# Patient Record
Sex: Male | Born: 1940 | Race: White | Hispanic: No | Marital: Married | Smoking: Former smoker
Health system: Southern US, Community
[De-identification: ages and names within clinical notes are randomized; demographics above are authoritative.]

## PROBLEM LIST (undated history)

## (undated) DIAGNOSIS — I10 Essential (primary) hypertension: Secondary | ICD-10-CM

## (undated) DIAGNOSIS — Z902 Acquired absence of lung [part of]: Secondary | ICD-10-CM

## (undated) DIAGNOSIS — R51 Headache: Secondary | ICD-10-CM

## (undated) DIAGNOSIS — E7800 Pure hypercholesterolemia, unspecified: Secondary | ICD-10-CM

## (undated) DIAGNOSIS — I2510 Atherosclerotic heart disease of native coronary artery without angina pectoris: Secondary | ICD-10-CM

## (undated) DIAGNOSIS — I219 Acute myocardial infarction, unspecified: Secondary | ICD-10-CM

## (undated) DIAGNOSIS — E039 Hypothyroidism, unspecified: Secondary | ICD-10-CM

## (undated) DIAGNOSIS — Z950 Presence of cardiac pacemaker: Secondary | ICD-10-CM

## (undated) DIAGNOSIS — R519 Headache, unspecified: Secondary | ICD-10-CM

## (undated) HISTORY — DX: Acute myocardial infarction, unspecified: I21.9

## (undated) HISTORY — PX: CARPAL TUNNEL RELEASE: SHX101

## (undated) HISTORY — DX: Hypothyroidism, unspecified: E03.9

## (undated) HISTORY — DX: Atherosclerotic heart disease of native coronary artery without angina pectoris: I25.10

## (undated) HISTORY — PX: CORONARY ANGIOPLASTY WITH STENT PLACEMENT: SHX49

## (undated) HISTORY — PX: CARDIAC CATHETERIZATION: SHX172

## (undated) HISTORY — PX: HAMMER TOE SURGERY: SHX385

## (undated) HISTORY — PX: LOBECTOMY: SHX5089

## (undated) HISTORY — PX: TOTAL KNEE ARTHROPLASTY: SHX125

## (undated) HISTORY — DX: Pure hypercholesterolemia, unspecified: E78.00

## (undated) HISTORY — PX: SHOULDER SURGERY: SHX246

---

## 2000-09-27 DIAGNOSIS — I2510 Atherosclerotic heart disease of native coronary artery without angina pectoris: Secondary | ICD-10-CM

## 2000-09-27 HISTORY — DX: Atherosclerotic heart disease of native coronary artery without angina pectoris: I25.10

## 2006-05-24 ENCOUNTER — Ambulatory Visit: Payer: Self-pay | Admitting: Gastroenterology

## 2007-01-14 ENCOUNTER — Emergency Department: Payer: Self-pay | Admitting: Emergency Medicine

## 2007-02-11 ENCOUNTER — Inpatient Hospital Stay: Payer: Self-pay | Admitting: Internal Medicine

## 2007-04-15 ENCOUNTER — Other Ambulatory Visit: Payer: Self-pay

## 2007-04-15 ENCOUNTER — Inpatient Hospital Stay: Payer: Self-pay | Admitting: Internal Medicine

## 2007-05-20 ENCOUNTER — Ambulatory Visit: Payer: Self-pay | Admitting: Orthopaedic Surgery

## 2007-09-07 ENCOUNTER — Ambulatory Visit: Payer: Self-pay | Admitting: Orthopaedic Surgery

## 2008-05-20 ENCOUNTER — Emergency Department: Payer: Self-pay | Admitting: Emergency Medicine

## 2009-12-03 ENCOUNTER — Ambulatory Visit: Payer: Self-pay | Admitting: Family Medicine

## 2011-02-24 ENCOUNTER — Ambulatory Visit: Payer: Self-pay | Admitting: Internal Medicine

## 2011-08-09 ENCOUNTER — Ambulatory Visit: Payer: Self-pay | Admitting: General Practice

## 2011-08-23 ENCOUNTER — Inpatient Hospital Stay: Payer: Self-pay | Admitting: General Practice

## 2011-08-23 DIAGNOSIS — Z01810 Encounter for preprocedural cardiovascular examination: Secondary | ICD-10-CM

## 2011-08-27 ENCOUNTER — Encounter: Payer: Self-pay | Admitting: Internal Medicine

## 2011-08-28 ENCOUNTER — Encounter: Payer: Self-pay | Admitting: Internal Medicine

## 2011-09-28 ENCOUNTER — Encounter: Payer: Self-pay | Admitting: Internal Medicine

## 2011-10-29 ENCOUNTER — Encounter: Payer: Self-pay | Admitting: Internal Medicine

## 2012-09-27 DIAGNOSIS — Z902 Acquired absence of lung [part of]: Secondary | ICD-10-CM

## 2012-09-27 HISTORY — DX: Acquired absence of lung (part of): Z90.2

## 2013-03-16 ENCOUNTER — Ambulatory Visit: Payer: Self-pay | Admitting: Gastroenterology

## 2013-03-20 LAB — PATHOLOGY REPORT

## 2013-08-09 ENCOUNTER — Ambulatory Visit: Payer: Self-pay | Admitting: Unknown Physician Specialty

## 2013-12-10 ENCOUNTER — Ambulatory Visit: Payer: Self-pay | Admitting: Specialist

## 2014-01-03 ENCOUNTER — Ambulatory Visit: Payer: Self-pay | Admitting: Cardiothoracic Surgery

## 2014-01-25 ENCOUNTER — Ambulatory Visit: Payer: Self-pay | Admitting: Cardiothoracic Surgery

## 2014-01-28 ENCOUNTER — Ambulatory Visit: Payer: Self-pay | Admitting: Cardiothoracic Surgery

## 2014-02-07 ENCOUNTER — Ambulatory Visit: Payer: Self-pay

## 2014-02-07 LAB — CBC WITH DIFFERENTIAL/PLATELET
BASOS PCT: 0.6 %
Basophil #: 0 10*3/uL (ref 0.0–0.1)
Eosinophil #: 0.1 10*3/uL (ref 0.0–0.7)
Eosinophil %: 1.8 %
HCT: 45.9 % (ref 40.0–52.0)
HGB: 15.3 g/dL (ref 13.0–18.0)
LYMPHS ABS: 2.1 10*3/uL (ref 1.0–3.6)
Lymphocyte %: 38.2 %
MCH: 33.3 pg (ref 26.0–34.0)
MCHC: 33.4 g/dL (ref 32.0–36.0)
MCV: 100 fL (ref 80–100)
Monocyte #: 0.6 x10 3/mm (ref 0.2–1.0)
Monocyte %: 12 %
NEUTROS ABS: 2.6 10*3/uL (ref 1.4–6.5)
Neutrophil %: 47.4 %
Platelet: 164 10*3/uL (ref 150–440)
RBC: 4.61 10*6/uL (ref 4.40–5.90)
RDW: 13.8 % (ref 11.5–14.5)
WBC: 5.4 10*3/uL (ref 3.8–10.6)

## 2014-02-14 LAB — PROTIME-INR
INR: 1
PROTHROMBIN TIME: 12.7 s (ref 11.5–14.7)

## 2014-02-14 LAB — APTT: ACTIVATED PTT: 27.6 s (ref 23.6–35.9)

## 2014-02-15 ENCOUNTER — Inpatient Hospital Stay: Payer: Self-pay

## 2014-02-15 DIAGNOSIS — I442 Atrioventricular block, complete: Secondary | ICD-10-CM

## 2014-02-15 LAB — BASIC METABOLIC PANEL
ANION GAP: 10 (ref 7–16)
BUN: 9 mg/dL (ref 7–18)
Calcium, Total: 8.2 mg/dL — ABNORMAL LOW (ref 8.5–10.1)
Chloride: 100 mmol/L (ref 98–107)
Co2: 26 mmol/L (ref 21–32)
Creatinine: 0.98 mg/dL (ref 0.60–1.30)
EGFR (Non-African Amer.): >60
Glucose: 153 mg/dL — ABNORMAL HIGH (ref 65–99)
Osmolality: 274 (ref 275–301)
Potassium: 4.4 mmol/L (ref 3.5–5.1)
SODIUM: 136 mmol/L (ref 136–145)

## 2014-02-15 LAB — CBC WITH DIFFERENTIAL/PLATELET
Basophil #: 0 10*3/uL (ref 0.0–0.1)
Basophil %: 0.4 %
Eosinophil #: 0 10*3/uL (ref 0.0–0.7)
Eosinophil %: 0 %
HCT: 42.9 % (ref 40.0–52.0)
HGB: 14.5 g/dL (ref 13.0–18.0)
Lymphocyte #: 1 10*3/uL (ref 1.0–3.6)
Lymphocyte %: 11.2 %
MCH: 33.7 pg (ref 26.0–34.0)
MCHC: 33.9 g/dL (ref 32.0–36.0)
MCV: 99 fL (ref 80–100)
MONO ABS: 0.6 x10 3/mm (ref 0.2–1.0)
Monocyte %: 6.5 %
Neutrophil #: 7.6 10*3/uL — ABNORMAL HIGH (ref 1.4–6.5)
Neutrophil %: 81.9 %
Platelet: 169 10*3/uL (ref 150–440)
RBC: 4.31 10*6/uL — ABNORMAL LOW (ref 4.40–5.90)
RDW: 13.8 % (ref 11.5–14.5)
WBC: 9.3 10*3/uL (ref 3.8–10.6)

## 2014-02-16 LAB — BASIC METABOLIC PANEL
Anion Gap: 7 (ref 7–16)
BUN: 9 mg/dL (ref 7–18)
CO2: 30 mmol/L (ref 21–32)
CREATININE: 0.9 mg/dL (ref 0.60–1.30)
Calcium, Total: 8.4 mg/dL — ABNORMAL LOW (ref 8.5–10.1)
Chloride: 98 mmol/L (ref 98–107)
EGFR (African American): >60
EGFR (Non-African Amer.): >60
Glucose: 136 mg/dL — ABNORMAL HIGH (ref 65–99)
OSMOLALITY: 271 (ref 275–301)
Potassium: 4.1 mmol/L (ref 3.5–5.1)
Sodium: 135 mmol/L — ABNORMAL LOW (ref 136–145)

## 2014-02-16 LAB — HEMOGLOBIN: HGB: 13.8 g/dL (ref 13.0–18.0)

## 2014-02-17 LAB — BASIC METABOLIC PANEL
Anion Gap: 1 — ABNORMAL LOW (ref 7–16)
BUN: 6 mg/dL — AB (ref 7–18)
Calcium, Total: 8.4 mg/dL — ABNORMAL LOW (ref 8.5–10.1)
Chloride: 96 mmol/L — ABNORMAL LOW (ref 98–107)
Co2: 32 mmol/L (ref 21–32)
Creatinine: 1.06 mg/dL (ref 0.60–1.30)
EGFR (Non-African Amer.): >60
Glucose: 135 mg/dL — ABNORMAL HIGH (ref 65–99)
OSMOLALITY: 259 (ref 275–301)
Potassium: 4.3 mmol/L (ref 3.5–5.1)
Sodium: 129 mmol/L — ABNORMAL LOW (ref 136–145)

## 2014-02-17 LAB — CBC WITH DIFFERENTIAL/PLATELET
Basophil #: 0 10*3/uL (ref 0.0–0.1)
Basophil %: 0.3 %
Eosinophil #: 0 10*3/uL (ref 0.0–0.7)
Eosinophil %: 0.2 %
HCT: 40.5 % (ref 40.0–52.0)
HGB: 13.7 g/dL (ref 13.0–18.0)
LYMPHS PCT: 13.7 %
Lymphocyte #: 1.3 10*3/uL (ref 1.0–3.6)
MCH: 33.8 pg (ref 26.0–34.0)
MCHC: 33.8 g/dL (ref 32.0–36.0)
MCV: 100 fL (ref 80–100)
Monocyte #: 1.2 x10 3/mm — ABNORMAL HIGH (ref 0.2–1.0)
Monocyte %: 12.1 %
Neutrophil #: 7 10*3/uL — ABNORMAL HIGH (ref 1.4–6.5)
Neutrophil %: 73.7 %
PLATELETS: 144 10*3/uL — AB (ref 150–440)
RBC: 4.06 10*6/uL — AB (ref 4.40–5.90)
RDW: 13.4 % (ref 11.5–14.5)
WBC: 9.5 10*3/uL (ref 3.8–10.6)

## 2014-02-18 LAB — CBC WITH DIFFERENTIAL/PLATELET
BASOS PCT: 0.5 %
Basophil #: 0 10*3/uL (ref 0.0–0.1)
EOS PCT: 1.1 %
Eosinophil #: 0.1 10*3/uL (ref 0.0–0.7)
HCT: 37.1 % — AB (ref 40.0–52.0)
HGB: 12.8 g/dL — AB (ref 13.0–18.0)
LYMPHS PCT: 13.3 %
Lymphocyte #: 1.2 10*3/uL (ref 1.0–3.6)
MCH: 34.2 pg — AB (ref 26.0–34.0)
MCHC: 34.4 g/dL (ref 32.0–36.0)
MCV: 99 fL (ref 80–100)
MONO ABS: 1.1 x10 3/mm — AB (ref 0.2–1.0)
Monocyte %: 11.3 %
NEUTROS PCT: 73.8 %
Neutrophil #: 6.8 10*3/uL — ABNORMAL HIGH (ref 1.4–6.5)
Platelet: 136 10*3/uL — ABNORMAL LOW (ref 150–440)
RBC: 3.73 10*6/uL — AB (ref 4.40–5.90)
RDW: 13.4 % (ref 11.5–14.5)
WBC: 9.3 10*3/uL (ref 3.8–10.6)

## 2014-02-18 LAB — BASIC METABOLIC PANEL
Anion Gap: 0 — ABNORMAL LOW (ref 7–16)
BUN: 5 mg/dL — ABNORMAL LOW (ref 7–18)
Calcium, Total: 8.5 mg/dL (ref 8.5–10.1)
Chloride: 98 mmol/L (ref 98–107)
Co2: 31 mmol/L (ref 21–32)
Creatinine: 0.89 mg/dL (ref 0.60–1.30)
EGFR (African American): >60
Glucose: 145 mg/dL — ABNORMAL HIGH (ref 65–99)
OSMOLALITY: 259 (ref 275–301)
Potassium: 4.3 mmol/L (ref 3.5–5.1)
Sodium: 129 mmol/L — ABNORMAL LOW (ref 136–145)

## 2014-02-19 LAB — CBC WITH DIFFERENTIAL/PLATELET
BASOS ABS: 0 10*3/uL (ref 0.0–0.1)
Basophil %: 0.3 %
EOS ABS: 0.1 10*3/uL (ref 0.0–0.7)
EOS PCT: 1.3 %
HCT: 35.5 % — AB (ref 40.0–52.0)
HGB: 12.1 g/dL — AB (ref 13.0–18.0)
LYMPHS ABS: 1.1 10*3/uL (ref 1.0–3.6)
LYMPHS PCT: 12.6 %
MCH: 34.1 pg — AB (ref 26.0–34.0)
MCHC: 34.3 g/dL (ref 32.0–36.0)
MCV: 100 fL (ref 80–100)
Monocyte #: 1 x10 3/mm (ref 0.2–1.0)
Monocyte %: 11.3 %
NEUTROS PCT: 74.5 %
Neutrophil #: 6.7 10*3/uL — ABNORMAL HIGH (ref 1.4–6.5)
PLATELETS: 159 10*3/uL (ref 150–440)
RBC: 3.56 10*6/uL — AB (ref 4.40–5.90)
RDW: 13.4 % (ref 11.5–14.5)
WBC: 9 10*3/uL (ref 3.8–10.6)

## 2014-02-20 LAB — CBC WITH DIFFERENTIAL/PLATELET
Basophil #: 0 10*3/uL (ref 0.0–0.1)
Basophil %: 0.2 %
Eosinophil #: 0.2 10*3/uL (ref 0.0–0.7)
Eosinophil %: 3.2 %
HCT: 36.2 % — AB (ref 40.0–52.0)
HGB: 12.2 g/dL — ABNORMAL LOW (ref 13.0–18.0)
LYMPHS PCT: 18 %
Lymphocyte #: 1.4 10*3/uL (ref 1.0–3.6)
MCH: 33.7 pg (ref 26.0–34.0)
MCHC: 33.8 g/dL (ref 32.0–36.0)
MCV: 100 fL (ref 80–100)
Monocyte #: 1 x10 3/mm (ref 0.2–1.0)
Monocyte %: 12.6 %
NEUTROS ABS: 5 10*3/uL (ref 1.4–6.5)
NEUTROS PCT: 66 %
Platelet: 176 10*3/uL (ref 150–440)
RBC: 3.62 10*6/uL — ABNORMAL LOW (ref 4.40–5.90)
RDW: 13.2 % (ref 11.5–14.5)
WBC: 7.6 10*3/uL (ref 3.8–10.6)

## 2014-02-20 LAB — BASIC METABOLIC PANEL
ANION GAP: 5 — AB (ref 7–16)
BUN: 6 mg/dL — ABNORMAL LOW (ref 7–18)
CHLORIDE: 95 mmol/L — AB (ref 98–107)
Calcium, Total: 8.6 mg/dL (ref 8.5–10.1)
Co2: 31 mmol/L (ref 21–32)
Creatinine: 0.59 mg/dL — ABNORMAL LOW (ref 0.60–1.30)
Glucose: 128 mg/dL — ABNORMAL HIGH (ref 65–99)
OSMOLALITY: 262 (ref 275–301)
POTASSIUM: 3.7 mmol/L (ref 3.5–5.1)
SODIUM: 131 mmol/L — AB (ref 136–145)

## 2014-02-25 LAB — PATHOLOGY REPORT

## 2014-02-28 ENCOUNTER — Ambulatory Visit: Payer: Self-pay | Admitting: Cardiothoracic Surgery

## 2014-03-27 ENCOUNTER — Ambulatory Visit: Payer: Self-pay | Admitting: Cardiothoracic Surgery

## 2014-06-20 ENCOUNTER — Ambulatory Visit: Payer: Self-pay | Admitting: Cardiothoracic Surgery

## 2014-06-27 ENCOUNTER — Ambulatory Visit: Payer: Self-pay | Admitting: Cardiothoracic Surgery

## 2015-01-18 NOTE — Op Note (Signed)
PATIENT NAME:  Jose [name], Jose [name] MR#:  [id number] DATE OF BIRTH:  1941-08-24  DATE OF PROCEDURE:  02/15/2014  SURGEON: Dr. Thelma Barge.   ASSISTANT: Marshia Ly, MD  PREOPERATIVE DIAGNOSIS: Right upper lobe mass.   POSTOPERATIVE DIAGNOSIS: Right upper lobe mass.   OPERATION PERFORMED:  1. Preoperative bronchoscopy to assess endobronchial anatomy.  2. Right thoracotomy with right upper lobectomy and mediastinal lymph node dissection.   INDICATIONS FOR PROCEDURE: Jose [name] is a 74 year old gentleman with a past medical history of remote smoking who has developed a right upper lobe mass. This measured about 2-cm in size and had some sub solid and solid components. It was worrisome for a malignancy and he was offered the above-named procedure for definitive diagnosis. In addition, if this were a malignancy, the patient would have had appropriate therapy. He was apprised of the indications and risks, and he gave his informed consent.   DESCRIPTION OF PROCEDURE: The patient was brought to the operating suite and placed in the supine position. General anesthesia was given. The patient was then intubated with a single-lumen tube and a bronchial blocker was placed in the right mainstem. There was no evidence of endobronchial tumor in any of the major airways. The patient was then turned for a right thoracotomy. All pressure points were carefully padded. The patient was prepped and draped in the usual sterile fashion. A posterolateral fifth interspace thoracotomy was performed. The latissimus muscle was divided but the serratus was spared. Upon entering the chest, careful palpation of the lung revealed a vague density within the right upper lobe. There was no obvious tumor outside the lung. The fissure between the upper and middle lobe was nearly nonexistent. Likewise the fissure between the upper lobe and the lower lobe posteriorly was likewise very deficient. We began by opening the mediastinal pleura over the superior  pulmonary vein and pulmonary artery. The branches of the pulmonary artery were identified. The middle lobe vein was identified and separated from the upper lobe vein. At this point, we attempted to identify the pulmonary artery in the depths of the fissure. This took considerable dissection as the planes were difficult and the fissures were nearly incomplete. Once the pulmonary artery was identified in the depths of the fissure, we could then trace the posterior segmental vein over to the hilum and then we were able to divide the fissure between the upper lobe and the middle lobe. Prior to this point, we had divided the upper lobe arterial branches and the upper lobe venous branches. The fissure was completed anteriorly as mentioned with the stapler. The only remaining structure at this point was the posterior ascending branch and the incomplete fissure posteriorly. The posterior ascending branch was taken with a vascular stapler and the fissure completed with a regular stapler using gold loads. The bronchus was the only remaining structure and we swept all the lymph nodes up into the bronchus and then divided the bronchus with a TX30 stapler. The middle and lower lobes ventilated nicely prior to firing the stapler. Once we fired the stapler, we then removed the specimen and performed a complete lymphadenectomy within the pretracheal space and in the hilar areas. All lymph nodes were removed and appropriately labeled. We then checked the bronchial stump at 30 cm of water pressure and we did not identify any air leak. Two chest tubes were inserted in a standard fashion. An angled and a straight 28 tube were placed. The stapling port site was closed with 0 Vicryl, 2-0  Vicryl, and nylon. The chest was closed after stapling the middle and lower lobes together as these were quite free. The ribs were reapproximated with #2 Vicryl. The latissimus muscle was closed with #2 Vicryl, the subcutaneous tissues with 2-0 Vicryl,  and the skin with skin clips. The patient was then rolled in the supine position where he was then extubated and taken to the recovery room.    ____________________________ Sheppard Plumberimothy E. Thelma Bargeaks, MD teo:lt D: 02/15/2014 20:04:40 ET T: 02/16/2014 05:24:59 ET JOB#: [id number]  cc: Marcial Pacasimothy E. Thelma Bargeaks, MD, <Dictator> Danella PentonMark F. Miller, MD Jasmine DecemberIMOTHY E Jamiaya Bina MD ELECTRONICALLY SIGNED 03/02/2014 19:51

## 2015-01-18 NOTE — Discharge Summary (Signed)
PATIENT NAME:  Jose [name], [name] MR#:  [id number] DATE OF BIRTH:  [birth year]  DATE OF ADMISSION:  02/15/2014 DATE OF DISCHARGE:  02/23/2014  ADMITTING DIAGNOSIS: Right upper lobe mass.    DISCHARGE DIAGNOSES: Right upper lobe mass. (Pulmonary infarct).  HOSPITAL COURSE: [name] is a 74 year old gentleman with a newly diagnosed right upper lobe mass. He did have a remote smoking history and there was some concern that this may represent a malignancy. He was therefore offered the above-named procedure for definitive diagnosis and treatment. He was brought to the Operating Room on 02/15/2014, where he underwent a right upper lobectomy. His is final pathology revealed no evidence of malignancy, but rather a pulmonary infarct. There were no other pathologic findings within the lung. His postoperative course was complicated by his atrial arrhythmias and he was seen by our cardiologist on several occasions, but ultimately nothing was required for his atrial arrhythmia, and he was managed on the floor. He did have his chest tubes removed on the 05/30 and a followup chest x-ray showed the lung to be fully expanded. The patient was discharged to home.   DISCHARGE MEDICATIONS: Included: Simvastatin, levothyroxine, metoprolol, Norco 7.5/325, simethicone for gas. He was scheduled to follow up with Dr. Thelma Bargeaks in one week. He will also follow-up with his primary care physician, Dr. Judithann SheenSparks in one month.     ____________________________ Sheppard Plumberimothy E. Thelma Bargeaks, MD teo:lm D: 03/02/2014 19:56:00 ET T: 03/03/2014 01:47:56 ET JOB#: [id number]  cc: Marcial Pacasimothy E. Thelma Bargeaks, MD, <Dictator> Jasmine DecemberIMOTHY E Dynastie Knoop MD ELECTRONICALLY SIGNED 03/13/2014 14:48

## 2015-01-18 NOTE — Consult Note (Signed)
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   Patient was seen by consultant.   Consult note dictated.   Comments: REC No further treatment.  Electronic Signatures: Marcina MillardParaschos, Carsen Machi (MD)  (Signed 29-May-15 19:11)  Authored: Brief Consult Note   Last Updated: 29-May-15 19:11 by Marcina MillardParaschos, Nikita Humble (MD)

## 2015-01-18 NOTE — Consult Note (Signed)
PATIENT NAME:  Jose [name], Jose D MR#:  [id number] DATE OF BIRTH:  1941-07-06  DATE OF CONSULTATION:  02/15/2014  REFERRING PHYSICIAN:  Timothy E. Thelma Bargeaks, MD CONSULTING PHYSICIAN:  Francoise Chojnowski A. Allena KatzPatel, MD  PRIMARY CARE PHYSICIAN: Danella PentonMark F. Miller, MD  CARDIOLOGIST: Marcina MillardAlexander Paraschos, MD   REASON FOR CONSULTATION: Postop medical management.  HISTORY OF PRESENT ILLNESS: Mr. Jose [name] is a 74 year old Caucasian gentleman with past medical history of coronary artery disease, status post stent in mid RCA in 2002 and 50% to 60% stenosis of proximal LAD by cardiac cath of July 2008; hyperlipidemia; history of Mobitz I  second-degree AV block, was admitted for elective right upper lobectomy for a right upper lobe mass. The surgery was performed by Dr. Thelma Bargeaks. Internal medicine was consulted for postop medical management. The patient was noted to have heart rate drop down into 45 transiently. During my evaluation, the patient has been complaining of some chest pain and heart rate is in the 60s. Surgery was done under general anesthesia. He is currently on IV ropivacaine epidural drip along with fentanyl for pain management. The patient denies any chest pain or shortness of breath.   PAST MEDICAL HISTORY:  1.  Hammertoe repair, right foot.  2.  Hypercholesteremia.  3.  Hypothyroidism.  4.  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  5.  Right total knee placement.  6.  Shoulder surgery.   ALLERGIES: AMOXICILLIN/AUGMENTIN.   SOCIAL HISTORY: Married and retired. Has 2 children. Occasional alcohol. Ex-smoker.   FAMILY HISTORY: Father died at 5693 of CVA. Mother died at age 648 of MI.    HOME MEDICATIONS: 1.  Simvastatin 40 mg p.o. daily.  2.  Metoprolol extended-release 25 mg daily.  3.  Levothyroxine 25 mcg p.o. daily.  4.  Tylenol 500 mg 1 tablet b.i.d.   REVIEW OF SYSTEMS:   CARDIOVASCULAR: Positive for chest pain at chest tube site. No arrhythmia. No syncope. No dizziness.  GASTROINTESTINAL: No  nausea, vomiting, diarrhea, or abdominal pain.  GENITOURINARY: No dysuria, hematuria, or frequency.  ENDOCRINE: No polyuria, nocturia, or thyroid problems.  HEMATOLOGIC: No anemia or easy bruising or bleeding.  SKIN: No acne or rash.  NEUROLOGIC: No CVA, TIA, vertigo, or dizziness.  PSYCHIATRIC: No anxiety or depression.   All other systems reviewed and negative.   PHYSICAL EXAMINATION: VITAL SIGNS: The patient is seen in the ICU. He is afebrile. Pulse is 62. Blood pressure is 125/62. Sats are 97% on 2 liters.  GENERAL: The patient is obese, in mild to moderate distress due to chest pain.  HEENT: Atraumatic, normocephalic. Pupils: PERRLA. EOM intact. Oral mucosa is moist.  NECK: Supple. No JVD. No carotid bruit.  LUNGS: Clear to auscultation bilaterally. Decreased breath sounds on the right side in the bases. No respiratory distress or labored breathing.  HEART: Both the heart sounds are normal. Rate, rhythm regular. PMI not lateralized. Chest nontender.  EXTREMITIES: Good pedal pulses, good femoral pulses. Trace lower extremity edema.  NEUROLOGIC: Unable to assess in detail secondary to the patient being postop; however, moves all extremities well.  SKIN: Warm and dry.   LABORATORY, DIAGNOSTIC, AND RADIOLOGICAL DATA: Chest x-ray shows postoperative right chest with new right mediastinal distortion. No pneumothorax. Right pleural effusion. Opacity at the peripheral left base could be from under-penetration, small effusion, or lung capacity. CBC within normal limits. Basic metabolic panel within normal limits, except glucose of 153. PT-INR is 12.7 and 1. EKG shows rate of 71 with second-degree Mobitz  type II.   ASSESSMENT: A 74 year old Mr. Jose [name] with history of hypothyroidism, coronary artery disease, status post stent in the remote past, is postop right upper lobectomy. Internal medicine was consulted for:  1.  Postoperative management: The patient was noted to have intermittent  bradycardia. Spoke with Dr. Lady Gary reviewed EKG. Will hold off on beta blockers. Monitor on telemetry. Heart rate currently is anywhere from 60 to 71. The patient has EKG suggestive of Mobitz I second-degree AV block. His primary cardiologist is Dr. Darrold Junker. He has had a stress test prior to the surgery, which was essentially within normal limits. Will cycle cardiac enzymes x 3 and consider cardiology consultation if needed.  2.  Hypothyroidism: Continue levothyroxine.  3.  Deep vein thrombosis prophylaxis: On sequential compression devices and TEDs.  4.  Right upper lobectomy secondary to right upper lobe mass suspicious for possible malignancy. The patient is postop day zero. Further management by Dr. Thelma Barge.  5.  Hyperlipidemia: Continue simvastatin.  6.  Further workup according to the patient's clinical course.  Thank you for the consult. Will follow while the patient is in-house.   TIME SPENT: 50 minutes. The above was discussed with Dr. Thelma Barge.   ____________________________ [name]. Allena Katz, MD sap:jcm D: 02/15/2014 18:53:30 ET T: 02/15/2014 19:33:17 ET JOB#: [id number]  cc: Yaire Kreher A. Allena Katz, MD, <Dictator> Willow Ora MD ELECTRONICALLY SIGNED 02/17/2014 16:26

## 2015-01-18 NOTE — Consult Note (Signed)
   Present Illness 74 yo male with history of chornic Mobitz 1 heart block who is post op RUL lung resection. Telemetry and EKG have revealed second degree heart block with intermittant of what appears av dissociation. Pt ventricular response was stable. No prolonged pasuses. Hemodynamically stable. Has history of cad sp pci or rca in 2002. NO evidence of ischemia present.   Physical Exam:  GEN well nourished   HEENT PERRL, hearing intact to voice   NECK supple   RESP no use of accessory muscles  right sided chest  tube in place   CARD Irregular rate and rhythm  No murmur   ABD denies tenderness  no Adominal Mass   LYMPH negative neck, negative axillae   EXTR negative cyanosis/clubbing, negative edema   SKIN normal to palpation   NEURO cranial nerves intact, motor/sensory function intact   PSYCH A+O to time, place, person   Review of Systems:  Subjective/Chief Complaint right sided chest wall pain at chest tube site   General: Fatigue   Skin: No Complaints   ENT: No Complaints   Eyes: No Complaints   Neck: No Complaints   Respiratory: Short of breath   Cardiovascular: Dyspnea   Gastrointestinal: No Complaints   Genitourinary: No Complaints   Vascular: No Complaints   Musculoskeletal: No Complaints   Neurologic: No Complaints   Hematologic: No Complaints   Endocrine: No Complaints   Psychiatric: No Complaints   Review of Systems: All other systems were reviewed and found to be negative   Medications/Allergies Reviewed Medications/Allergies reviewed   Family & Social History:  Family and Social History:  Family History Non-Contributory   Social History negative tobacco   EKG:  Interpretation sinus rhtyhm with mobitz 1 heart block with intermitant of what appears to be av dissociation.    Augmentin: N/V   Impression Pt with history of mobitz 1 heart block per Dr. Darrold JunkerParaschos who is s/p rul lung resection. EKG shows persistant type 2 and  occasional av dissasociation. Ventricular rate is stable. This is a chronic condition. Does not appear to be hemodynaically significant. Would not place a ppm or temp pacer at this timie. Will follow rhythm.   Plan 1. Continue post op care 2. Follow rhythm 3. WQould not place ppm or temp pacer at present. 4. WIll resume metoprolol succinate 25 daily which he was on as outpatient   Electronic Signatures: Dalia HeadingFath, Laylanie Kruczek A (MD)  (Signed 24-May-15 08:59)  Authored: General Aspect/Present Illness, History and Physical Exam, Review of System, Family & Social History, Home Medications, EKG , Allergies, Impression/Plan   Last Updated: 24-May-15 08:59 by Dalia HeadingFath, Terrilyn Tyner A (MD)

## 2015-01-20 IMAGING — CR RIGHT HAND - COMPLETE 3+ VIEW
1 series · 4 of 4 positions shown · non-contrast
Comparison: None.

CLINICAL DATA: Right hand swelling and erythema.

EXAM:
RIGHT HAND - COMPLETE 3+ VIEW

[Series 1: pa · 0.17mm/px · 4 of 4 slices shown]
[im 1/4]
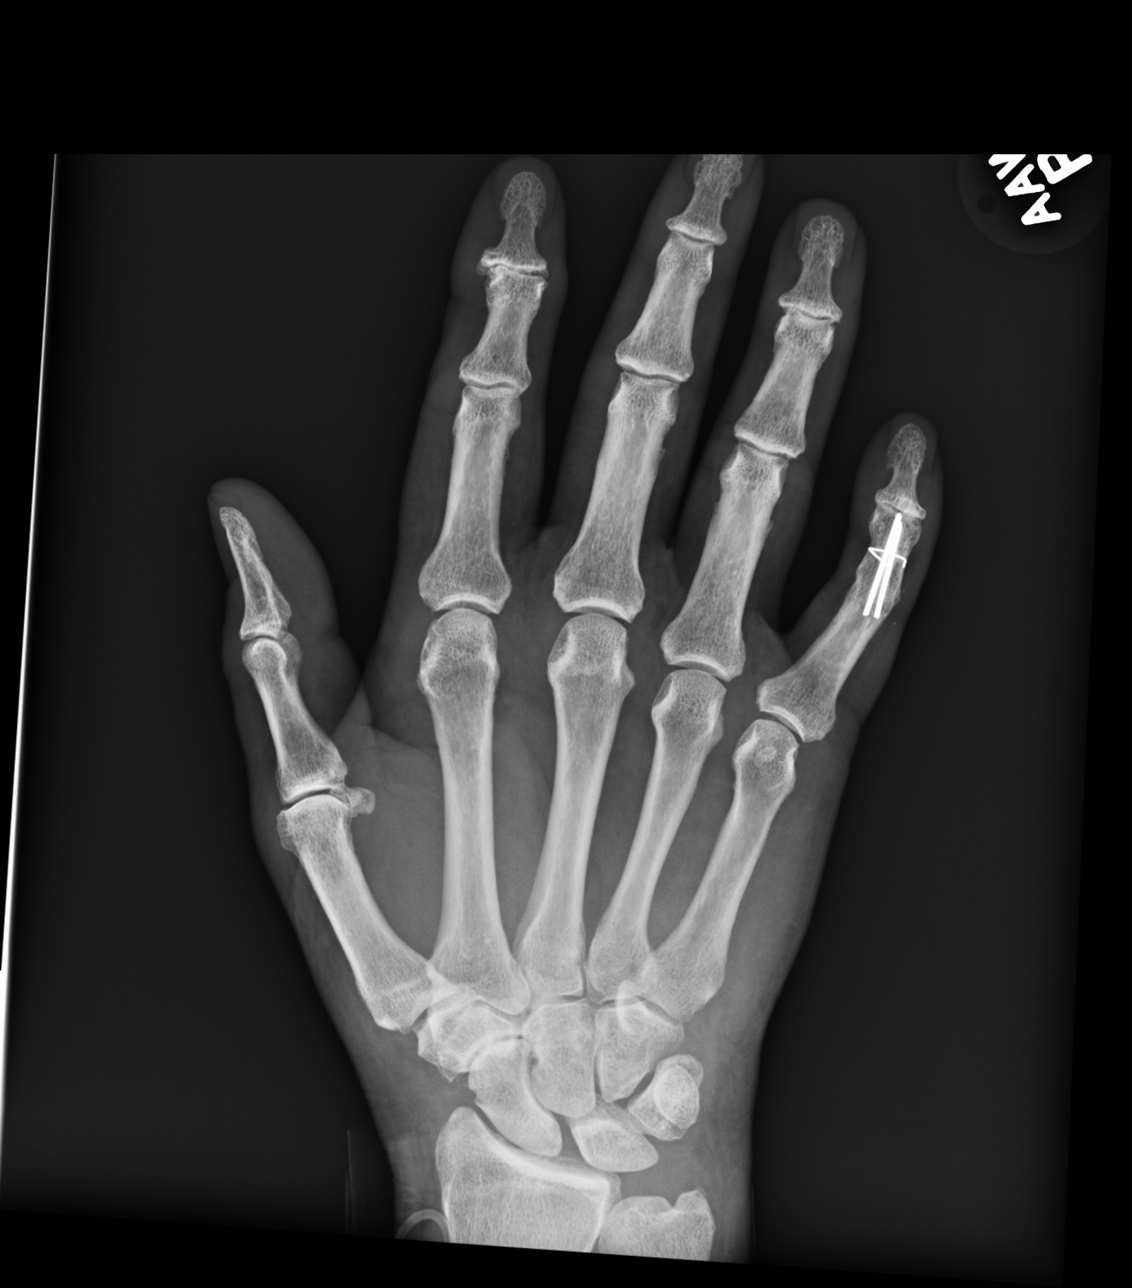
[im 2/4]
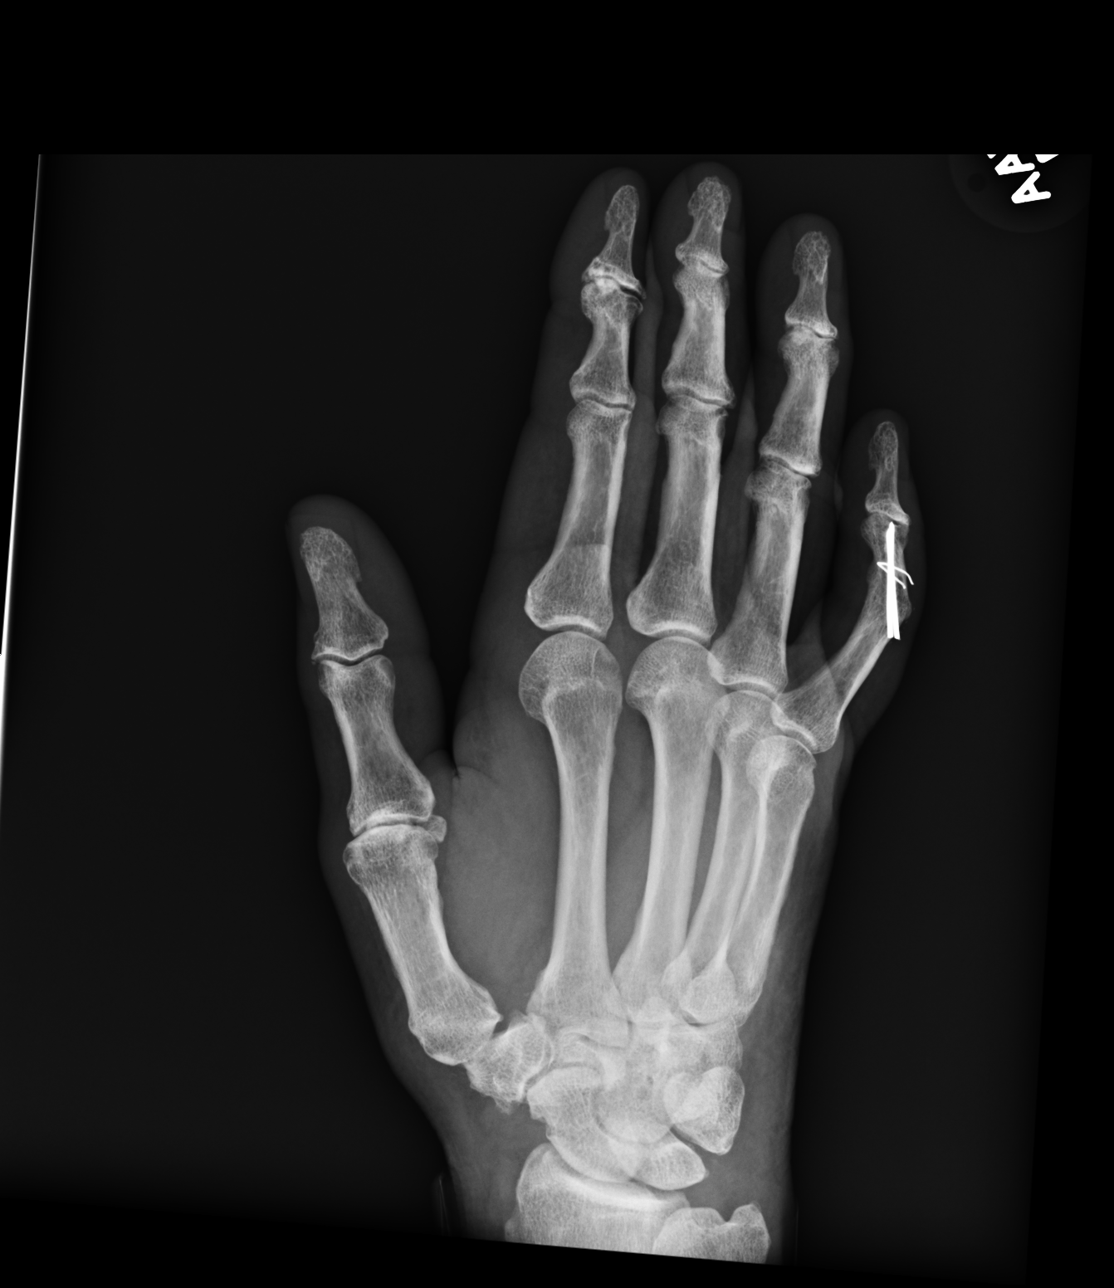
[im 3/4]
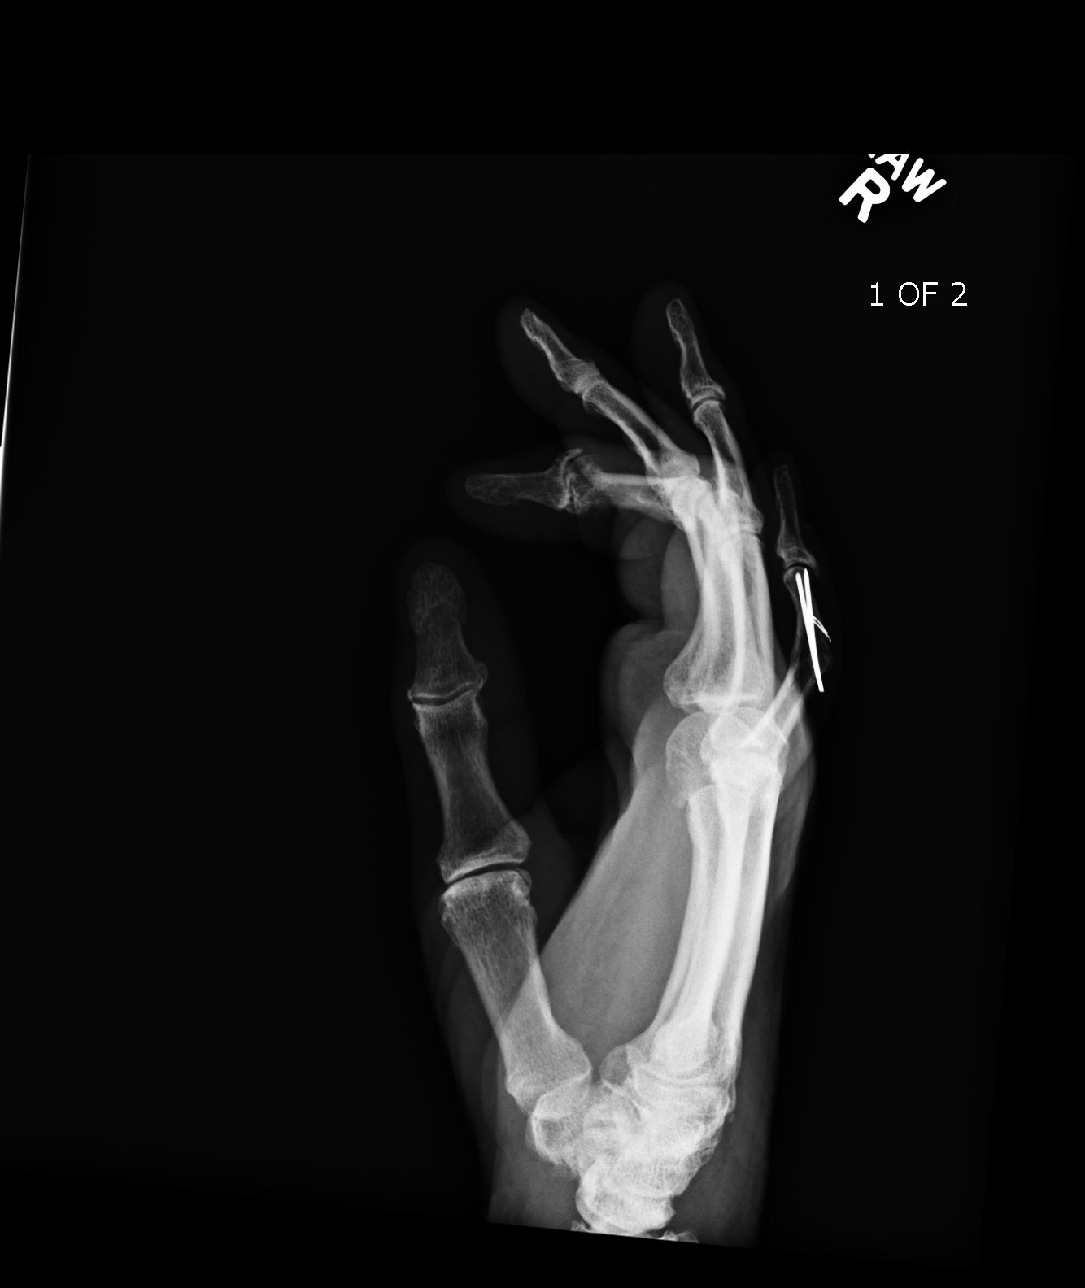
[im 4/4]
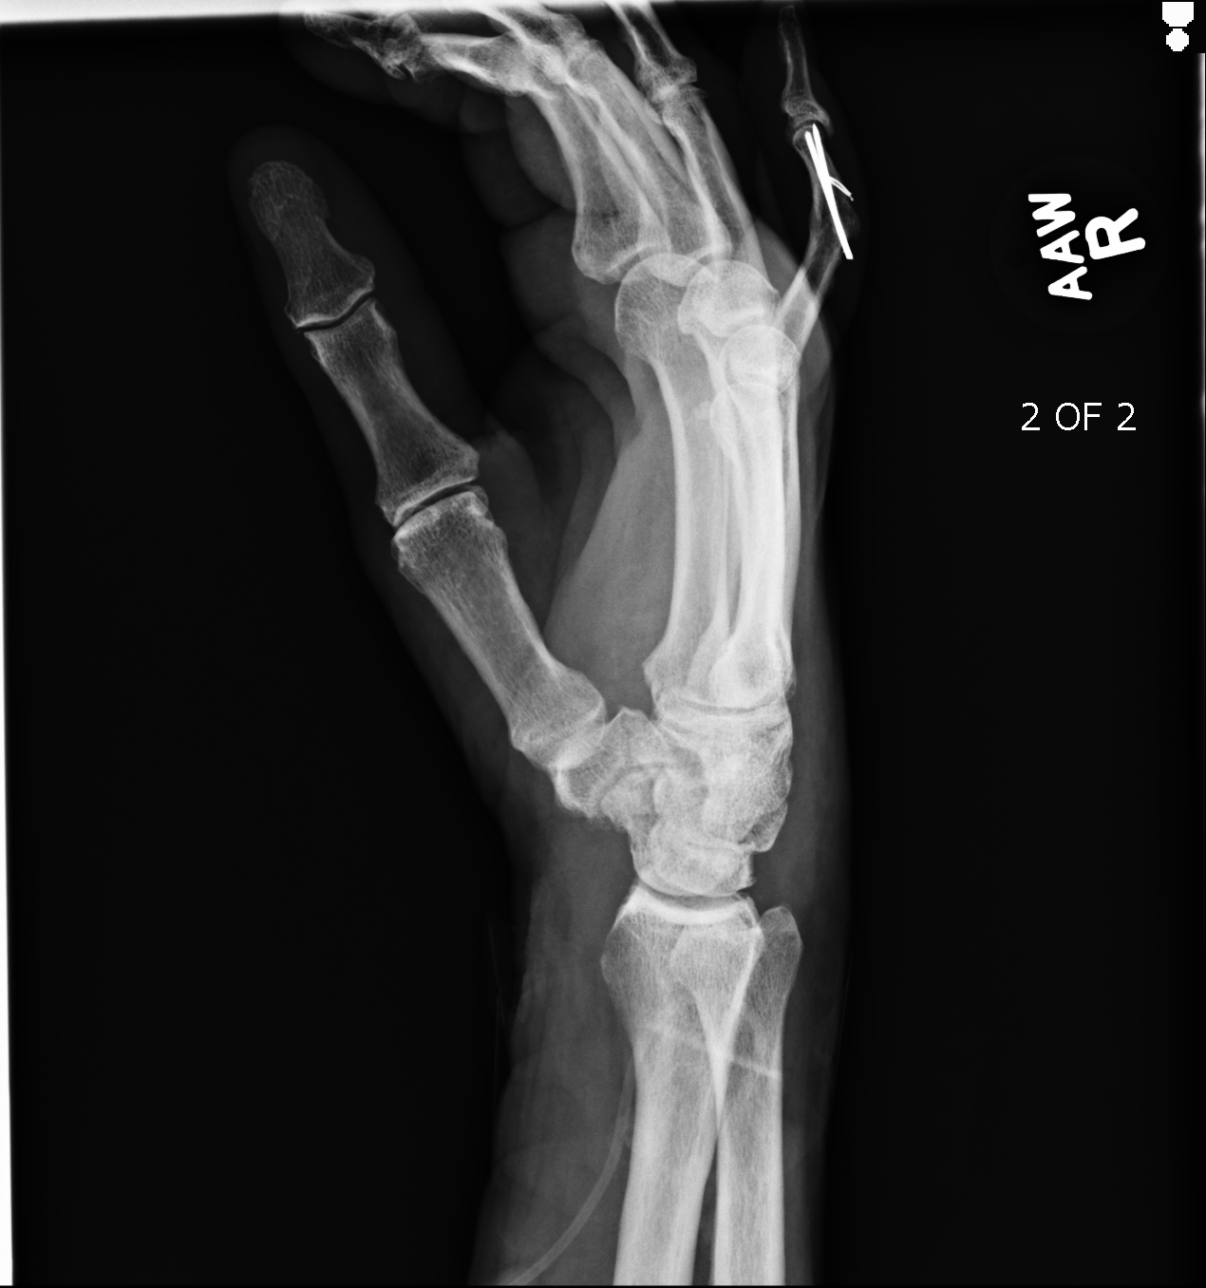

[4 of 4 positions shown; findings below may reference images not displayed]

FINDINGS: The mineralization and alignment are normal. There is no evidence of
acute fracture or dislocation. There are postsurgical changes status
post pin and wire arthrodesis of the fifth middle and proximal
phalanges. There are scattered interphalangeal degenerative changes,
most advanced at the second distal interphalangeal joint. No erosive
changes are evident. The soft tissues appear diffusely prominent
without apparent focal swelling.
IMPRESSION: No acute osseous findings. Osteoarthritis and postsurgical changes
as described.

## 2015-02-25 HISTORY — PX: INSERT / REPLACE / REMOVE PACEMAKER: SUR710

## 2015-03-12 ENCOUNTER — Other Ambulatory Visit: Payer: Self-pay | Admitting: Internal Medicine

## 2015-03-12 DIAGNOSIS — R918 Other nonspecific abnormal finding of lung field: Secondary | ICD-10-CM

## 2015-03-18 ENCOUNTER — Ambulatory Visit
Admission: RE | Admit: 2015-03-18 | Discharge: 2015-03-18 | Disposition: A | Discharge status: Home / Self Care | Payer: Medicare Other | Source: Ambulatory Visit | Attending: Internal Medicine | Admitting: Internal Medicine

## 2015-03-18 DIAGNOSIS — R05 Cough: Secondary | ICD-10-CM | POA: Diagnosis present

## 2015-03-18 DIAGNOSIS — R918 Other nonspecific abnormal finding of lung field: Secondary | ICD-10-CM

## 2015-03-18 HISTORY — DX: Essential (primary) hypertension: I10

## 2015-03-18 HISTORY — DX: Presence of cardiac pacemaker: Z95.0

## 2015-03-18 MED ORDER — IOHEXOL 300 MG/ML  SOLN
75.0000 mL | Freq: Once | INTRAMUSCULAR | Status: AC | PRN
  Administered 2015-03-18: 75 mL via INTRAVENOUS

## 2015-04-29 ENCOUNTER — Ambulatory Visit: Payer: Self-pay | Admitting: Cardiovascular Disease

## 2015-05-02 ENCOUNTER — Encounter: Payer: Self-pay | Admitting: Cardiovascular Disease

## 2015-05-02 ENCOUNTER — Encounter (INDEPENDENT_AMBULATORY_CARE_PROVIDER_SITE_OTHER): Payer: Self-pay

## 2015-05-02 ENCOUNTER — Ambulatory Visit (INDEPENDENT_AMBULATORY_CARE_PROVIDER_SITE_OTHER): Payer: Medicare Other | Admitting: Cardiovascular Disease

## 2015-05-02 VITALS — BP 118/68 | HR 66 | Ht 73.0 in | Wt 253.2 lb

## 2015-05-02 DIAGNOSIS — I495 Sick sinus syndrome: Secondary | ICD-10-CM

## 2015-05-02 DIAGNOSIS — Z950 Presence of cardiac pacemaker: Secondary | ICD-10-CM | POA: Diagnosis not present

## 2015-05-02 DIAGNOSIS — I2510 Atherosclerotic heart disease of native coronary artery without angina pectoris: Secondary | ICD-10-CM

## 2015-05-02 DIAGNOSIS — Z955 Presence of coronary angioplasty implant and graft: Secondary | ICD-10-CM

## 2015-05-02 DIAGNOSIS — E785 Hyperlipidemia, unspecified: Secondary | ICD-10-CM

## 2015-05-02 NOTE — Assessment & Plan Note (Signed)
Acute onset of bradycardia 2 months ago, status post pacemaker We'll arrange follow-up with EP, Dr. Graciela Husbands for pacemaker check

## 2015-05-02 NOTE — Assessment & Plan Note (Signed)
CT scan reviewed, dual-chamber device Medtronic

## 2015-05-02 NOTE — Assessment & Plan Note (Signed)
Prior stent to the RCA, patent on catheterization in 2008

## 2015-05-02 NOTE — Progress Notes (Signed)
Patient ID: Jose [name], male    DOB: 1941-01-11, 74 y.o.   MRN: [id number]  HPI Comments: Jose [name] is a pleasant 74 year old gentleman with a history of coronary artery disease, stent to his RCA in 2002 at the time of an MI, notes detailing 50-60% proximal LAD disease, 75% OM 2 disease, last catheterization July 2008 at which time the RCA was reportedly patent, history of hyperlipidemia, prior smoking history from age 74 up to 31, recent episode of sick sinus syndrome, bradycardia with heart rate in the 30s, general malaise and shortness of breath, admission to [facility] for Medtronic pacemaker, dual-chamber, who presents to the [place] office to establish care  He reports that he was at [facility] a proximally 2 months ago when he developed malaise, shortness of breath. He went to the emergency room and was found to have heart rate in the 30s. Pacemaker was placed at [facility] without complication. He did not follow-up with the physician who placed the device for any reprogramming.  In general he feels well, does have chronic shortness of breath following prior lobectomy of the right upper lobe several years ago for benign growth.  Relatively deconditioned, no regular exercise program.  Previously seen by [facility] but wanted to change to our office for various reasons. He does report having stress test several years ago on nuclear. Does not feel he has had a major change in his symptoms since test was performed  EKG on today's visit shows normal sinus rhythm, ventricular pacing, rate 66 bpm  Lab work reviewed with him showing total cholesterol 150 range, LDL 66 Glucose 110     Allergies  Allergen Reactions  . Amoxicillin Nausea Only  . Amoxicillin-Pot Clavulanate Other (See Comments)    Patient reports he took oral Augmentin after a foot surgery, and it caused severe nausea; however reports that he can take 'plain' amoxicillin; he takes amoxicillin prior to any  dentist procedures, and has on hand    Current Outpatient Prescriptions on File Prior to Visit  Medication Sig Dispense Refill  . aspirin 81 MG chewable tablet Take 81 mg by mouth daily.    Marland Kitchen levothyroxine (SYNTHROID, LEVOTHROID) 25 MCG tablet Take 25 mcg by mouth daily before breakfast.     . meloxicam (MOBIC) 15 MG tablet Take 15 mg by mouth daily.     . metoprolol succinate (TOPROL-XL) 25 MG 24 hr tablet Take 25 mg by mouth daily.     . simvastatin (ZOCOR) 40 MG tablet Take 40 mg by mouth daily at 6 PM.      No current facility-administered medications on file prior to visit.    Past Medical History  Diagnosis Date  . Hypertension   . Pacemaker   . Hypercholesteremia   . Hypothyroidism   . MI (myocardial infarction)   . Coronary artery disease 2002    RCA stent, 2002. 50-60% stenosis of proximal LAD, 75% stenosis OM2, patent stent mid RCA by cardiac catheterization, 04/17/07    Past Surgical History  Procedure Laterality Date  . Hammer toe surgery    . Total knee arthroplasty      right  . Shoulder surgery    . Cardiac catheterization    . Coronary angioplasty with stent placement    . Lobectomy      upper right   . Insert / replace / remove pacemaker  02/25/2015    Serial # [id number] H Model # [id number]/ DDD    Social History  reports that he quit smoking about 50 years ago. His smoking use included Cigarettes. He has a 25 pack-year smoking history. He does not have any smokeless tobacco history on file. He reports that he does not drink alcohol or use illicit drugs.  Family History family history includes Heart attack (age of onset: 38) in his mother.  Review of Systems  Constitutional: Negative.   Respiratory: Positive for shortness of breath.   Cardiovascular: Negative.   Gastrointestinal: Negative.   Musculoskeletal: Negative.   Neurological: Positive for numbness.  Hematological: Negative.   Psychiatric/Behavioral: Negative.   All other systems reviewed and  are negative.   BP 118/68 mmHg  Pulse 66  Ht  (1.854 m)  Wt 253 lb 4 oz (114.873 kg)  BMI 33.42 kg/m2  Physical Exam  Constitutional: He is oriented to person, place, and time. He appears well-developed and well-nourished.  HENT:  Head: Normocephalic.  Nose: Nose normal.  Mouth/Throat: Oropharynx is clear and moist.  Eyes: Conjunctivae are normal. Pupils are equal, round, and reactive to light.  Neck: Normal range of motion. Neck supple. No JVD present.  Cardiovascular: Normal rate, regular rhythm, normal heart sounds and intact distal pulses.  Exam reveals no gallop and no friction rub.   No murmur heard. Pulmonary/Chest: Effort normal and breath sounds normal. No respiratory distress. He has no wheezes. He has no rales. He exhibits no tenderness.  Abdominal: Soft. Bowel sounds are normal. He exhibits no distension. There is no tenderness.  Musculoskeletal: Normal range of motion. He exhibits edema. He exhibits no tenderness.  Lymphadenopathy:    He has no cervical adenopathy.  Neurological: He is alert and oriented to person, place, and time. Coordination normal.  Skin: Skin is warm and dry. No rash noted. No erythema.  Psychiatric: He has a normal mood and affect. His behavior is normal. Judgment and thought content normal.  Vitals reviewed.

## 2015-05-02 NOTE — Patient Instructions (Signed)
You are doing well. No medication changes were made.  We will set up an appt with Dr. Berton Mount for pacer  Please call us if you have new issues that need to be addressed before your next appt.  Your physician wants you to follow-up in: 6 months.  You will receive a reminder letter in the mail two months in advance. If you don't receive a letter, please call our office to schedule the follow-up appointment.

## 2015-05-02 NOTE — Assessment & Plan Note (Signed)
Notes reviewed with him him a prior cardiac catheterization results, also looked at the CT scan June 2016 This shows significant coronary disease in all 3 vessels. He denies any unstable angina at this time Suspect his disease has been relatively stable since he stopped smoking at age 74, now with cholesterol well controlled We'll need to work on diet, weight loss, sugars are borderline elevated at 110 Strong he recommended he call us for any symptoms concerning for angina

## 2015-05-02 NOTE — Assessment & Plan Note (Signed)
Given his significant three-vessel CAD, would treat his cholesterol very aggressively We have encouraged continued exercise, careful diet management in an effort to lose weight.

## 2015-06-09 ENCOUNTER — Ambulatory Visit (INDEPENDENT_AMBULATORY_CARE_PROVIDER_SITE_OTHER): Payer: Medicare Other | Admitting: Internal Medicine

## 2015-06-09 ENCOUNTER — Other Ambulatory Visit: Payer: Self-pay

## 2015-06-09 ENCOUNTER — Encounter: Payer: Self-pay | Admitting: Internal Medicine

## 2015-06-09 ENCOUNTER — Encounter (INDEPENDENT_AMBULATORY_CARE_PROVIDER_SITE_OTHER): Payer: Self-pay

## 2015-06-09 VITALS — BP 120/70 | HR 83 | Ht 73.0 in | Wt 253.5 lb

## 2015-06-09 DIAGNOSIS — I480 Paroxysmal atrial fibrillation: Secondary | ICD-10-CM

## 2015-06-09 DIAGNOSIS — I441 Atrioventricular block, second degree: Secondary | ICD-10-CM | POA: Diagnosis not present

## 2015-06-09 DIAGNOSIS — R0602 Shortness of breath: Secondary | ICD-10-CM

## 2015-06-09 DIAGNOSIS — Z950 Presence of cardiac pacemaker: Secondary | ICD-10-CM

## 2015-06-09 DIAGNOSIS — G4733 Obstructive sleep apnea (adult) (pediatric): Secondary | ICD-10-CM

## 2015-06-09 LAB — CUP PACEART INCLINIC DEVICE CHECK
Battery Remaining Longevity: 132 mo
Battery Voltage: 3.06 V
Brady Statistic AP VP Percent: 25.74 %
Brady Statistic AP VS Percent: 0.04 %
Brady Statistic AS VS Percent: 0.52 %
Brady Statistic RA Percent Paced: 25.79 %
Brady Statistic RV Percent Paced: 99.43 %
Implantable Lead Implant Date: 20160531
Implantable Lead Location: 753860
Implantable Lead Model: 5076
Lead Channel Impedance Value: 437 Ohm
Lead Channel Pacing Threshold Amplitude: 0.625 V
Lead Channel Pacing Threshold Pulse Width: 0.4 ms
Lead Channel Pacing Threshold Pulse Width: 0.4 ms
Lead Channel Sensing Intrinsic Amplitude: 13.625 mV
Lead Channel Sensing Intrinsic Amplitude: 5.75 mV
Lead Channel Setting Pacing Amplitude: 1.5 V
Lead Channel Setting Pacing Pulse Width: 0.4 ms
MDC IDC LEAD IMPLANT DT: 20160531
MDC IDC LEAD LOCATION: 753859
MDC IDC MSMT LEADCHNL RA IMPEDANCE VALUE: 361 Ohm
MDC IDC MSMT LEADCHNL RA IMPEDANCE VALUE: 494 Ohm
MDC IDC MSMT LEADCHNL RA SENSING INTR AMPL: 7 mV
MDC IDC MSMT LEADCHNL RV IMPEDANCE VALUE: 494 Ohm
MDC IDC MSMT LEADCHNL RV PACING THRESHOLD AMPLITUDE: 0.5 V
MDC IDC MSMT LEADCHNL RV SENSING INTR AMPL: 13.5 mV
MDC IDC SESS DTM: 20160912145603
MDC IDC SET LEADCHNL RV PACING AMPLITUDE: 2 V
MDC IDC SET LEADCHNL RV SENSING SENSITIVITY: 1.2 mV
MDC IDC STAT BRADY AS VP PERCENT: 73.69 %

## 2015-06-09 NOTE — Patient Instructions (Addendum)
Medication Instructions:  Your physician recommends that you continue on your current medications as directed. Please refer to the Current Medication list given to you today.    Labwork: none  Testing/Procedures: Remote monitoring is used to monitor your Pacemaker of ICD from home. This monitoring reduces the number of office visits required to check your device to one time per year. It allows us to keep an eye on the functioning of your device to ensure it is working properly.[place] You are scheduled for a device check from home on Dec 12. You may send your transmission at any time that day. If you have a wireless device, the transmission will be sent automatically. After your physician reviews your transmission, you will receive a postcard with your next transmission date.  Your physician has requested that you have a lexiscan myoview. For further information please visit [email or web address] Please follow instruction sheet, as given.  [facility] MYOVIEW  Your caregiver has ordered a Stress Test with nuclear imaging. The purpose of this test is to evaluate the blood supply to your heart muscle. This procedure is referred to as a "Non-Invasive Stress Test." This is because other than having an IV started in your vein, nothing is inserted or "invades" your body. Cardiac stress tests are done to find areas of poor blood flow to the heart by determining the extent of coronary artery disease (CAD). Some patients exercise on a treadmill, which naturally increases the blood flow to your heart, while others who are  unable to walk on a treadmill due to physical limitations have a pharmacologic/chemical stress agent called Lexiscan . This medicine will mimic walking on a treadmill by temporarily increasing your coronary blood flow.   Please note: these test may take anywhere between 2-4 hours to complete  PLEASE REPORT TO [facility] ENTRANCE  THE VOLUNTEERS AT THE FIRST DESK WILL DIRECT YOU WHERE TO  GO  Date of Procedure: Friday, Sept 16, 8:30am Arrival Time for Procedure: 8:00am  Instructions regarding medication:     _xx___:  Hold metoprolol night before procedure and morning of procedure   PLEASE NOTIFY THE OFFICE AT LEAST 24 HOURS IN ADVANCE IF YOU ARE UNABLE TO KEEP YOUR APPOINTMENT.  [phone] AND  PLEASE NOTIFY NUCLEAR MEDICINE AT [facility] AT LEAST 24 HOURS IN ADVANCE IF YOU ARE UNABLE TO KEEP YOUR APPOINTMENT. [phone]  How to prepare for your Myoview test:   Do not eat or drink after midnight  No caffeine for 24 hours prior to test  No smoking 24 hours prior to test.  Your medication may be taken with water.  If your doctor stopped a medication because of this test, do not take that medication.  Ladies, please do not wear dresses.  Skirts or pants are appropriate. Please wear a short sleeve shirt.  No perfume, cologne or lotion.  Wear comfortable walking shoes. No heels!            Follow-Up: Your physician wants you to follow-up in: 9 months with Dr. Graciela Husbands.  You will receive a reminder letter in the mail two months in advance. If you don't receive a letter, please call our office to schedule the follow-up appointment.   Any Other Special Instructions Will Be Listed Below (If Applicable).  Nuclear Medicine Exam A nuclear medicine exam is a safe and painless imaging test. It helps to detect and diagnose disease in the body as well as provide information about organ function and structure.  Nuclear scans are most often done  of the:  Lungs.  Heart.  Thyroid gland.  Bones.  Abdomen. HOW A NUCLEAR MEDICINE EXAM WORKS A nuclear medicine exam works by using a radioactive tracer. The material is given either by an IV (intravenous) injection or it may be swallowed. After the tracer is in the body, it is absorbed by your body's organs. A large scanning machine that uses a special camera detects the radioactivity in your body. A computerized image is  then formed regarding the area of concern. The small amounts of radioactive material used in a nuclear medicine exam are found to be medically safe. However, because radioactive material is used, this test is not done if you are pregnant or nursing.  BEFORE THE PROCEDURE  If available, bring previous imaging studies such as x-rays, etc. with you to the exam.  Arrive early for your exam. PROCEDURE  An IV may be started before the exam begins.  Depending on the type of examination, will lie on a table or sit in a chair during the exam.  The nuclear medicine exam will take about 30 to 60 minutes to complete. AFTER THE PROCEDURE  After your scan is completed, the image(s) will be evaluated by a specialist. It is important that you follow up with your caregiver to find out your test results.  You may return to your regular activity as instructed by your caregiver. SEEK IMMEDIATE MEDICAL CARE IF: You have shortness of breath or difficulty breathing. MAKE SURE YOU:   Understand these instructions.  Will watch your condition.  Will get help right away if you are not doing well or get worse. Document Released: 10/21/2004 Document Revised: 12/06/2011 Document Reviewed: 12/05/2008 [facility] Patient Information 2015 [place], [place]. This information is not intended to replace advice given to you by your health care provider. Make sure you discuss any questions you have with your health care provider.

## 2015-06-09 NOTE — Progress Notes (Signed)
ELECTROPHYSIOLOGY CONSULT NOTE  Patient ID: Jose [name], MRN: [id number], DOB/AGE: 1941-03-21 74 y.o. Admit date: (Not on file) Date of Consult: 06/09/2015  Primary Physician: Danella Penton., MD Primary Cardiologist: TG  Chief Complaint: pacemaker to establish   HPI Jose [name] is a 74 y.o. male  Referred to establish pacemaker follow-up undertaken 6/16.  He has a history of coronary artery disease with prior stenting in 2002 modest diffuse disease at catheterization 2008 who presented with exercise intolerance earlier this summer. He was found to have a heart rate of 30 bpm and underwent dual-chamber pacing.  He has a history of prior lobectomy of his right upper lobe for what turned out to be a benign growth.  Echocardiogram from [facility] was reviewed. It demonstrated normal LV function mild left atrial enlargement pulmonary hypertension  He has modest exercise intolerance which may be worse than it was 6 months ago. He denies chest discomfort peripheral edema.  He does have sleep disordered breathing and he said his wife tells me he stops breathing at night.   Past Medical History  Diagnosis Date  . Hypertension   . Pacemaker   . Hypercholesteremia   . Hypothyroidism   . MI (myocardial infarction)   . Coronary artery disease 2002    RCA stent, 2002. 50-60% stenosis of proximal LAD, 75% stenosis OM2, patent stent mid RCA by cardiac catheterization, 04/17/07      Surgical History:  Past Surgical History  Procedure Laterality Date  . Hammer toe surgery    . Total knee arthroplasty      right  . Shoulder surgery    . Cardiac catheterization    . Coronary angioplasty with stent placement    . Lobectomy      upper right   . Insert / replace / remove pacemaker  02/25/2015    Serial # [id number] H Model # A2DR01/ DDD     Home Meds: Prior to Admission medications   Medication Sig Start Date End Date Taking? Authorizing Provider  aspirin 81 MG chewable  tablet Take 81 mg by mouth daily.   Yes Historical Provider, MD  levothyroxine (SYNTHROID, LEVOTHROID) 25 MCG tablet Take 25 mcg by mouth daily before breakfast.  03/12/15  Yes Historical Provider, MD  meloxicam (MOBIC) 15 MG tablet Take 15 mg by mouth daily.  03/12/15  Yes Historical Provider, MD  metoprolol succinate (TOPROL-XL) 25 MG 24 hr tablet Take 25 mg by mouth daily.  03/12/15  Yes Historical Provider, MD  simvastatin (ZOCOR) 40 MG tablet Take 40 mg by mouth daily at 6 PM.  03/12/15  Yes Historical Provider, MD      Allergies:  Allergies  Allergen Reactions  . Amoxicillin Nausea Only  . Amoxicillin-Pot Clavulanate Other (See Comments)    Patient reports he took oral Augmentin after a foot surgery, and it caused severe nausea; however reports that he can take 'plain' amoxicillin; he takes amoxicillin prior to any dentist procedures, and has on hand    Social History   Social History  . Marital Status: Married    Spouse Name: N/A  . Number of Children: N/A  . Years of Education: N/A   Occupational History  . Not on file.   Social History Main Topics  . Smoking status: Former Smoker -- 1.00 packs/day for 25 years    Types: Cigarettes    Quit date: 10/29/1964  . Smokeless tobacco: Not on file  . Alcohol Use: Yes  . Drug Use:  No  . Sexual Activity: Not on file   Other Topics Concern  . Not on file   Social History Narrative     Family History  Problem Relation Age of Onset  . Heart attack Mother 48     ROS:  Please see the history of present illness.     All other systems reviewed and negative.    Physical Exam:   Blood pressure 120/70, pulse 83, height  (1.854 m), weight 253 lb 8 oz (114.987 kg). General: Well developed, well nourished male in no acute distress. Head: Normocephalic, atraumatic, sclera non-icteric, no xanthomas, nares are without discharge. EENT: normal Lymph Nodes:  none Back: without scoliosis/kyphosis , no CVA tendersness Neck:  Negative for carotid bruits. JVD not elevated. Device pocket well healed; without hematoma or erythema.  There is no tethering  Lungs: Clear bilaterally to auscultation without wheezes, rales, or rhonchi. Breathing is unlabored. Heart: RRR with S1 S2.  2/6 systolic  murmur , rubs, or gallops appreciated. Abdomen: Soft, non-tender, non-distended with normoactive bowel sounds. No hepatomegaly. No rebound/guarding. No obvious abdominal masses. Msk:  Strength and tone appear normal for age. Extremities: No clubbing or cyanosis. Tr   edema.  Distal pedal pulses are 2+ and equal bilaterally. Skin: Warm and Dry Neuro: Alert and oriented X 3. CN III-XII intact Grossly normal sensory and motor function . Psych:  Responds to questions appropriately with a normal affect.      Labs: Cardiac Enzymes No results for input(s): CKTOTAL, CKMB, TROPONINI in the last 72 hours. CBC Lab Results  Component Value Date   WBC 7.6 02/20/2014   HGB 12.2* 02/20/2014   HCT 36.2* 02/20/2014   MCV 100 02/20/2014   PLT 176 02/20/2014   PROTIME: No results for input(s): LABPROT, INR in the last 72 hours. Chemistry No results for input(s): NA, K, CL, CO2, BUN, CREATININE, CALCIUM, PROT, BILITOT, ALKPHOS, ALT, AST, GLUCOSE in the last 168 hours.  Invalid input(s): LABALBU Lipids No results found for: CHOL, HDL, LDLCALC, TRIG BNP No results found for: PROBNP Thyroid Function Tests: No results for input(s): TSH, T4TOTAL, T3FREE, THYROIDAB in the last 72 hours.  Invalid input(s): FREET3    Miscellaneous No results found for: DDIMER  Radiology/Studies:  No results found.  EKG:  Sinus rhythm with the synchronous pacing2   Assessment and Plan:  Symptomatic heart block-Mobitz 1  Pacemaker-Medtronic  Obstructive sleep apnea  Pulmonary hypertension  Obesity  Coronary artery disease with prior stenting  Atrial fibrillation-nonsustained Pacemaker function is normal and the device was reprogrammed to  maximize longevity.  I think he needs a sleep study and I will be in touch with his primary care physician \ I'm also concerned about his worsening exercise tolerance; it has been years since his coronary anatomy was evaluated; we will undertake Myoview scanning.  He's had infrequent isolated episode of atrial fibrillation. We will continue to monitor this. I don't think the burden is sufficiently great justify anticoagulation    Sherryl Manges

## 2015-06-10 DIAGNOSIS — I4891 Unspecified atrial fibrillation: Secondary | ICD-10-CM | POA: Insufficient documentation

## 2015-06-10 DIAGNOSIS — G4733 Obstructive sleep apnea (adult) (pediatric): Secondary | ICD-10-CM | POA: Insufficient documentation

## 2015-06-13 ENCOUNTER — Encounter
Admission: RE | Admit: 2015-06-13 | Discharge: 2015-06-13 | Disposition: A | Discharge status: Home / Self Care | Payer: Medicare Other | Source: Ambulatory Visit | Attending: Internal Medicine | Admitting: Internal Medicine

## 2015-06-13 DIAGNOSIS — R0602 Shortness of breath: Secondary | ICD-10-CM | POA: Diagnosis not present

## 2015-06-13 LAB — NM MYOCAR MULTI W/SPECT W/WALL MOTION / EF
CHL CUP NUCLEAR SSS: 9
CSEPEW: 1 METS
CSEPHR: 48 %
Exercise duration (min): 0 min
Exercise duration (sec): 0 s
LV dias vol: 146 mL
LVSYSVOL: 74 mL
MPHR: 146 {beats}/min
Peak HR: 71 {beats}/min
Rest HR: 61 {beats}/min
SDS: 0
SRS: 16
TID: 0.98

## 2015-06-13 MED ORDER — TECHNETIUM TC 99M SESTAMIBI GENERIC - CARDIOLITE
30.0000 | Freq: Once | INTRAVENOUS | Status: AC | PRN
  Administered 2015-06-13: 31.42 via INTRAVENOUS

## 2015-06-13 MED ORDER — REGADENOSON 0.4 MG/5ML IV SOLN
0.4000 mg | Freq: Once | INTRAVENOUS | Status: AC
  Administered 2015-06-13: 0.4 mg via INTRAVENOUS

## 2015-06-13 MED ORDER — TECHNETIUM TC 99M SESTAMIBI - CARDIOLITE
13.2190 | Freq: Once | INTRAVENOUS | Status: AC | PRN
  Administered 2015-06-13: 09:00:00 13.219 via INTRAVENOUS

## 2015-06-17 ENCOUNTER — Other Ambulatory Visit: Payer: Self-pay

## 2015-06-17 DIAGNOSIS — R0989 Other specified symptoms and signs involving the circulatory and respiratory systems: Secondary | ICD-10-CM

## 2015-06-20 ENCOUNTER — Other Ambulatory Visit: Payer: Self-pay | Admitting: Cardiovascular Disease

## 2015-06-20 ENCOUNTER — Other Ambulatory Visit: Payer: Self-pay

## 2015-06-20 ENCOUNTER — Ambulatory Visit (INDEPENDENT_AMBULATORY_CARE_PROVIDER_SITE_OTHER): Payer: Medicare Other

## 2015-06-20 DIAGNOSIS — R0989 Other specified symptoms and signs involving the circulatory and respiratory systems: Secondary | ICD-10-CM

## 2015-06-20 DIAGNOSIS — I495 Sick sinus syndrome: Secondary | ICD-10-CM | POA: Diagnosis not present

## 2015-06-20 DIAGNOSIS — R931 Abnormal findings on diagnostic imaging of heart and coronary circulation: Secondary | ICD-10-CM | POA: Diagnosis not present

## 2015-06-24 ENCOUNTER — Telehealth: Payer: Self-pay | Admitting: Cardiovascular Disease

## 2015-06-24 NOTE — Telephone Encounter (Signed)
Please see result note 

## 2015-06-24 NOTE — Telephone Encounter (Signed)
Returning sharon's call .  Please call back.

## 2015-06-25 ENCOUNTER — Encounter: Payer: Self-pay | Admitting: Cardiovascular Disease

## 2015-06-25 ENCOUNTER — Ambulatory Visit (INDEPENDENT_AMBULATORY_CARE_PROVIDER_SITE_OTHER): Payer: Medicare Other | Admitting: Cardiovascular Disease

## 2015-06-25 VITALS — BP 120/68 | HR 83 | Ht 73.0 in | Wt 246.0 lb

## 2015-06-25 DIAGNOSIS — I480 Paroxysmal atrial fibrillation: Secondary | ICD-10-CM | POA: Diagnosis not present

## 2015-06-25 DIAGNOSIS — Z950 Presence of cardiac pacemaker: Secondary | ICD-10-CM

## 2015-06-25 DIAGNOSIS — I2510 Atherosclerotic heart disease of native coronary artery without angina pectoris: Secondary | ICD-10-CM

## 2015-06-25 DIAGNOSIS — E785 Hyperlipidemia, unspecified: Secondary | ICD-10-CM

## 2015-06-25 DIAGNOSIS — Z955 Presence of coronary angioplasty implant and graft: Secondary | ICD-10-CM

## 2015-06-25 DIAGNOSIS — G4733 Obstructive sleep apnea (adult) (pediatric): Secondary | ICD-10-CM

## 2015-06-25 NOTE — Assessment & Plan Note (Signed)
Recommended he continue on his statin. Goal LDL less than 70

## 2015-06-25 NOTE — Progress Notes (Signed)
Patient ID: Jose [name], male    DOB: 1941/02/23, 74 y.o.   MRN: [id number]  HPI Comments: [name] is a pleasant 74 year old gentleman with a history of coronary artery disease, stent to his RCA in 2002 at the time of an MI, notes detailing 50-60% proximal LAD disease, 75% OM 2 disease, last catheterization July 2008 at which time the RCA was reportedly patent, history of hyperlipidemia, prior smoking history from age 86 up to 26, recent episode of sick sinus syndrome, bradycardia with heart rate in the 30s, general malaise and shortness of breath, admission to [facility] for Medtronic pacemaker, dual-chamber, who presents  For routine follow-up of his coronary artery disease. History of lobectomy for lesion in his lung, benign growth  He reports having worsening shortness of breath over the past several months, even prior to placement of his pacemaker over the summer. Wife reports he has been sweating more with exertion, increasing shortness of breath with activities. Still able to do some activities but more symptomatic. Symptoms have been relatively stable but noticeable.   Stress test showed apical perfusion defect, depressed ejection fraction Echocardiogram showed decreased ejection fraction from prior down to 40-45% with regions of wall motion abnormality No EKG on today's visit Last EKG August 2016 showing paced rhythm  Other past medical history   he was at [facility] over the summer 2016 when he developed malaise, shortness of breath. He went to the emergency room and was found to have heart rate in the 30s. Pacemaker was placed at [facility] without complication. No ischemia workup at that time  Lab work reviewed with him showing total cholesterol 150 range, LDL 66 Glucose 110     Allergies  Allergen Reactions  . Amoxicillin Nausea Only  . Amoxicillin-Pot Clavulanate Other (See Comments)    Patient reports he took oral Augmentin after a foot surgery, and it  caused severe nausea; however reports that he can take 'plain' amoxicillin; he takes amoxicillin prior to any dentist procedures, and has on hand    Current Outpatient Prescriptions on File Prior to Visit  Medication Sig Dispense Refill  . aspirin 81 MG chewable tablet Take 81 mg by mouth daily.    Marland Kitchen levothyroxine (SYNTHROID, LEVOTHROID) 25 MCG tablet Take 25 mcg by mouth daily before breakfast.     . meloxicam (MOBIC) 15 MG tablet Take 15 mg by mouth daily.     . metoprolol succinate (TOPROL-XL) 25 MG 24 hr tablet Take 25 mg by mouth daily.     . simvastatin (ZOCOR) 40 MG tablet Take 40 mg by mouth daily at 6 PM.      No current facility-administered medications on file prior to visit.    Past Medical History  Diagnosis Date  . Hypertension   . Pacemaker   . Hypercholesteremia   . Hypothyroidism   . MI (myocardial infarction)   . Coronary artery disease 2002    RCA stent, 2002. 50-60% stenosis of proximal LAD, 75% stenosis OM2, patent stent mid RCA by cardiac catheterization, 04/17/07    Past Surgical History  Procedure Laterality Date  . Hammer toe surgery    . Total knee arthroplasty      right  . Shoulder surgery    . Cardiac catheterization    . Coronary angioplasty with stent placement    . Lobectomy      upper right   . Insert / replace / remove pacemaker  02/25/2015    Serial # [id number]  Model # A2DR01/ DDD    Social History  reports that he quit smoking about 50 years ago. His smoking use included Cigarettes. He has a 25 pack-year smoking history. He does not have any smokeless tobacco history on file. He reports that he drinks alcohol. He reports that he does not use illicit drugs.  Family History family history includes Heart attack (age of onset: 26) in his mother.  Review of Systems  Constitutional: Negative.   Respiratory: Positive for shortness of breath.   Cardiovascular: Negative.   Gastrointestinal: Negative.   Musculoskeletal: Negative.    Neurological: Positive for numbness.  Hematological: Negative.   Psychiatric/Behavioral: Negative.   All other systems reviewed and are negative.   BP 120/68 mmHg  Pulse 83  Ht  (1.854 m)  Wt 246 lb (111.585 kg)  BMI 32.46 kg/m2  Physical Exam  Constitutional: He is oriented to person, place, and time. He appears well-developed and well-nourished.  HENT:  Head: Normocephalic.  Nose: Nose normal.  Mouth/Throat: Oropharynx is clear and moist.  Eyes: Conjunctivae are normal. Pupils are equal, round, and reactive to light.  Neck: Normal range of motion. Neck supple. No JVD present.  Cardiovascular: Normal rate, regular rhythm, normal heart sounds and intact distal pulses.  Exam reveals no gallop and no friction rub.   No murmur heard. Pulmonary/Chest: Effort normal and breath sounds normal. No respiratory distress. He has no wheezes. He has no rales. He exhibits no tenderness.  Abdominal: Soft. Bowel sounds are normal. He exhibits no distension. There is no tenderness.  Musculoskeletal: Normal range of motion. He exhibits edema. He exhibits no tenderness.  Lymphadenopathy:    He has no cervical adenopathy.  Neurological: He is alert and oriented to person, place, and time. Coordination normal.  Skin: Skin is warm and dry. No rash noted. No erythema.  Psychiatric: He has a normal mood and affect. His behavior is normal. Judgment and thought content normal.  Vitals reviewed.

## 2015-06-25 NOTE — Assessment & Plan Note (Signed)
Significant three-vessel disease on CT scan Stress test with perfusion defect, apical region, depressed ejection fraction Echocardiogram with worsening ejection fraction, wall motion abnormality All of the studies were shown to him in detail, discussed with him and his wife. Various treatment options discussed. Given his shortness of breath which seems to be getting worse over the past several months, we will schedule him for cardiac catheterization at his convenience. He will call us to schedule this when he knows his schedule

## 2015-06-25 NOTE — Assessment & Plan Note (Signed)
Followed by Dr. Klein 

## 2015-06-25 NOTE — Assessment & Plan Note (Signed)
Maintaining normal sinus rhythm on exam today. No EKG performed

## 2015-06-25 NOTE — Assessment & Plan Note (Signed)
Stent placed in 2002 to the RCA Catheterization in 2008, stent was patent We'll schedule repeat catheterization given shortness of breath symptoms, abnormal stress test, echo

## 2015-06-25 NOTE — Assessment & Plan Note (Signed)
He will talk with Dr. Hyacinth Meeker about scheduling a sleep study

## 2015-06-25 NOTE — Patient Instructions (Addendum)
You are doing well. No medication changes were made.  Please call the office when you would like to schedule a cardiac cath  Please call us if you have new issues that need to be addressed before your next appt.  Your physician wants you to follow-up in: 6 months.  You will receive a reminder letter in the mail two months in advance. If you don't receive a letter, please call our office to schedule the follow-up appointment.

## 2015-06-30 ENCOUNTER — Other Ambulatory Visit: Payer: Medicare Other

## 2015-08-12 ENCOUNTER — Encounter: Payer: Self-pay | Admitting: Internal Medicine

## 2015-09-01 ENCOUNTER — Telehealth: Payer: Self-pay | Admitting: *Deleted

## 2015-09-01 DIAGNOSIS — Z01812 Encounter for preprocedural laboratory examination: Secondary | ICD-10-CM

## 2015-09-01 DIAGNOSIS — R079 Chest pain, unspecified: Secondary | ICD-10-CM

## 2015-09-01 NOTE — Telephone Encounter (Signed)
Pt calling stating he would like to schedule a cath done by 09/21/15  Please call patient

## 2015-09-01 NOTE — Telephone Encounter (Signed)
Spoke w/ pt.  He states that he would like to proceed w/ setting up cath. Offered him cath on 09/12/15. He is agreeable to this date.  Advised him that I will speak w/ Dr. Mariah MillingGollan and let him know arrival time for procedure when it is scheduled. He is sched for precath labs on Fri, 12/9 @ 8:30. Pt had chest CT 6/16, so do not need chest x-ray.  Advised pt that I will go over instructions at his lab appt.

## 2015-09-02 NOTE — Telephone Encounter (Signed)
Pt sched for [facility] on 09/12/15 @ 9:30.  Pt verbalizes understanding to [facility] @ 8[facility]30. Advised him that I will go over full instructions at his lab appt on 12/9 and give him written instructions, as well.

## 2015-09-05 ENCOUNTER — Other Ambulatory Visit (INDEPENDENT_AMBULATORY_CARE_PROVIDER_SITE_OTHER): Payer: Medicare Other | Admitting: *Deleted

## 2015-09-05 DIAGNOSIS — Z01812 Encounter for preprocedural laboratory examination: Secondary | ICD-10-CM

## 2015-09-05 DIAGNOSIS — R079 Chest pain, unspecified: Secondary | ICD-10-CM

## 2015-09-06 LAB — CBC WITH DIFFERENTIAL/PLATELET
BASOS: 0 %
Basophils Absolute: 0 10*3/uL (ref 0.0–0.2)
EOS (ABSOLUTE): 0.2 10*3/uL (ref 0.0–0.4)
EOS: 4 %
HEMATOCRIT: 44 % (ref 37.5–51.0)
HEMOGLOBIN: 14.8 g/dL (ref 12.6–17.7)
IMMATURE GRANULOCYTES: 0 %
Immature Grans (Abs): 0 10*3/uL (ref 0.0–0.1)
LYMPHS ABS: 2.2 10*3/uL (ref 0.7–3.1)
Lymphs: 38 %
MCH: 32.1 pg (ref 26.6–33.0)
MCHC: 33.6 g/dL (ref 31.5–35.7)
MCV: 95 fL (ref 79–97)
MONOCYTES: 8 %
Monocytes Absolute: 0.5 10*3/uL (ref 0.1–0.9)
Neutrophils Absolute: 2.8 10*3/uL (ref 1.4–7.0)
Neutrophils: 50 %
Platelets: 206 10*3/uL (ref 150–379)
RBC: 4.61 x10E6/uL (ref 4.14–5.80)
RDW: 13.4 % (ref 12.3–15.4)
WBC: 5.7 10*3/uL (ref 3.4–10.8)

## 2015-09-06 LAB — PROTIME-INR
INR: 1 (ref 0.8–1.2)
Prothrombin Time: 10.5 s (ref 9.1–12.0)

## 2015-09-06 LAB — BASIC METABOLIC PANEL
BUN / CREAT RATIO: 13 (ref 10–22)
BUN: 11 mg/dL (ref 8–27)
CO2: 25 mmol/L (ref 18–29)
CREATININE: 0.83 mg/dL (ref 0.76–1.27)
Calcium: 9.2 mg/dL (ref 8.6–10.2)
Chloride: 98 mmol/L (ref 97–106)
GFR, EST AFRICAN AMERICAN: 100 mL/min/{1.73_m2} (ref >59)
GFR, EST NON AFRICAN AMERICAN: 87 mL/min/{1.73_m2} (ref >59)
Glucose: 172 mg/dL — ABNORMAL HIGH (ref 65–99)
Potassium: 4.8 mmol/L (ref 3.5–5.2)
Sodium: 138 mmol/L (ref 136–144)

## 2015-09-08 ENCOUNTER — Telehealth: Payer: Self-pay | Admitting: Cardiology

## 2015-09-08 ENCOUNTER — Encounter: Payer: Medicare Other | Admitting: *Deleted

## 2015-09-08 NOTE — Telephone Encounter (Signed)
Spoke with pt and reminded pt of remote transmission that is due today. Pt verbalized understanding.   

## 2015-09-09 ENCOUNTER — Encounter: Payer: Self-pay | Admitting: Cardiology

## 2015-09-11 ENCOUNTER — Telehealth: Payer: Self-pay

## 2015-09-11 ENCOUNTER — Other Ambulatory Visit: Payer: Self-pay | Admitting: Cardiovascular Disease

## 2015-09-11 DIAGNOSIS — I209 Angina pectoris, unspecified: Secondary | ICD-10-CM

## 2015-09-11 NOTE — H&P (Signed)
Patient ID: Jose [name], male DOB: August 28, 1941, 74 y.o. MRN: [id number]  HPI Comments: Jose [name] is a pleasant 74 year old gentleman with a history of coronary artery disease, stent to his RCA in 2002 at the time of an MI, notes detailing 50-60% proximal LAD disease, 75% OM 2 disease, last catheterization July 2008 at which time the RCA was reportedly patent, history of hyperlipidemia, prior smoking history from age 66 up to 40, recent episode of sick sinus syndrome, bradycardia with heart rate in the 30s, general malaise and shortness of breath, admission to [facility] for [employer] pacemaker, dual-chamber, who presents For routine follow-up of his coronary artery disease. History of lobectomy for lesion in his lung, benign growth  In September 2016, he reported having worsening shortness of breath over the past several months, even prior to placement of his pacemaker over the summer. Wife reported he has been sweating more with exertion, increasing shortness of breath with activities. Still able to do some activities but more symptomatic. Symptoms have been relatively stable but noticeable.   Cardiac catheterization was offered at that time. He contacted the office in early December and requested cardiac catheterization  Other past medical history Stress test showed apical perfusion defect, depressed ejection fraction Echocardiogram showed decreased ejection fraction from prior down to 40-45% with regions of wall motion abnormality No EKG on today's visit Last EKG August 2016 showing paced rhythm  he was at [facility] over the summer 2016 when he developed malaise, shortness of breath. He went to the emergency room and was found to have heart rate in the 30s. Pacemaker was placed at [facility] without complication. No ischemia workup at that time  Lab work reviewed with him showing total cholesterol 150 range, LDL 66 Glucose 110    Allergies  Allergen Reactions   . Amoxicillin Nausea Only  . Amoxicillin-Pot Clavulanate Other (See Comments)    Patient reports he took oral Augmentin after a foot surgery, and it caused severe nausea; however reports that he can take 'plain' amoxicillin; he takes amoxicillin prior to any dentist procedures, and has on hand    Current Outpatient Prescriptions on File Prior to Visit  Medication Sig Dispense Refill  . aspirin 81 MG chewable tablet Take 81 mg by mouth daily.    Marland Kitchen levothyroxine (SYNTHROID, LEVOTHROID) 25 MCG tablet Take 25 mcg by mouth daily before breakfast.     . meloxicam (MOBIC) 15 MG tablet Take 15 mg by mouth daily.     . metoprolol succinate (TOPROL-XL) 25 MG 24 hr tablet Take 25 mg by mouth daily.     . simvastatin (ZOCOR) 40 MG tablet Take 40 mg by mouth daily at 6 PM.      No current facility-administered medications on file prior to visit.    Past Medical History  Diagnosis Date  . Hypertension   . Pacemaker   . Hypercholesteremia   . Hypothyroidism   . MI (myocardial infarction)   . Coronary artery disease 2002    RCA stent, 2002. 50-60% stenosis of proximal LAD, 75% stenosis OM2, patent stent mid RCA by cardiac catheterization, 04/17/07    Past Surgical History  Procedure Laterality Date  . Hammer toe surgery    . Total knee arthroplasty      right  . Shoulder surgery    . Cardiac catheterization    . Coronary angioplasty with stent placement    . Lobectomy      upper right   . Insert /  replace / remove pacemaker  02/25/2015    Serial # [id number] H Model # A2DR01/ DDD    Social History  reports that he quit smoking about 50 years ago. His smoking use included Cigarettes. He has a 25 pack-year smoking history. He does not have any smokeless tobacco history on file. He reports that he drinks alcohol. He reports that he does not use illicit drugs.  Family  History family history includes Heart attack (age of onset: 78) in his mother.  Review of Systems  Constitutional: Negative.  Respiratory: Positive for shortness of breath.  Cardiovascular: Negative.  Gastrointestinal: Negative.  Musculoskeletal: Negative.  Neurological: Positive for numbness.  Hematological: Negative.  Psychiatric/Behavioral: Negative.  All other systems reviewed and are negative.   BP 120/68 mmHg  Pulse 83  Ht  (1.854 m)  Wt 246 lb (111.585 kg)  BMI 32.46 kg/m2  Physical Exam  Constitutional: He is oriented to person, place, and time. He appears well-developed and well-nourished.  HENT:  Head: Normocephalic.  Nose: Nose normal.  Mouth/Throat: Oropharynx is clear and moist.  Eyes: Conjunctivae are normal. Pupils are equal, round, and reactive to light.  Neck: Normal range of motion. Neck supple. No JVD present.  Cardiovascular: Normal rate, regular rhythm, normal heart sounds and intact distal pulses. Exam reveals no gallop and no friction rub.  No murmur heard. Pulmonary/Chest: Effort normal and breath sounds normal. No respiratory distress. He has no wheezes. He has no rales. He exhibits no tenderness.  Abdominal: Soft. Bowel sounds are normal. He exhibits no distension. There is no tenderness.  Musculoskeletal: Normal range of motion. He exhibits edema. He exhibits no tenderness.  Lymphadenopathy:   He has no cervical adenopathy.  Neurological: He is alert and oriented to person, place, and time. Coordination normal.  Skin: Skin is warm and dry. No rash noted. No erythema.  Psychiatric: He has a normal mood and affect. His behavior is normal. Judgment and thought content normal.  Vitals reviewed.                 CAD (coronary artery disease) -     Status: Written Related Problem: CAD (coronary artery disease)   Expand All Collapse All   Significant three-vessel disease on CT scan Stress test with perfusion defect,  apical region, depressed ejection fraction Echocardiogram with worsening ejection fraction, wall motion abnormality All of the studies were shown to him in detail, discussed with him and his wife. Various treatment options discussed. Given his shortness of breath which seems to be getting worse over the past several months, we will schedule him for cardiac catheterization             Atrial fibrillation -     Status: Written Related Problem: Atrial fibrillation   Expand All Collapse All   Maintaining normal sinus rhythm on exam today.              Hyperlipidemia -     Status: Written Related Problem: Hyperlipidemia   Expand All Collapse All   Recommended he continue on his statin. Goal LDL less than 70            History of coronary artery stent placement -     Status: Written Related Problem: History of coronary artery stent placement   Expand All Collapse All   Stent placed in 2002 to the RCA Catheterization in 2008, stent was patent We'll schedule repeat catheterization given shortness of breath symptoms, abnormal stress test, echo  pacemaker     Status: Written Related Problem: Pacemaker   Expand All Collapse All   Followed by Dr. Graciela HusbandsKlein            Obstructive sleep apnea -     Status: Written Related Problem: Obstructive sleep apnea   Expand All Collapse All   He will talk with Dr. Hyacinth MeekerMiller about scheduling a sleep study

## 2015-09-11 NOTE — Telephone Encounter (Signed)
Left detailed message on pt's vm w/ time and instructions for his cardiac cath tomorrow.  Asked him to call back w/ any questions or concerns.

## 2015-09-12 ENCOUNTER — Ambulatory Visit
Admission: RE | Admit: 2015-09-12 | Discharge: 2015-09-12 | Disposition: A | Discharge status: Home / Self Care | Payer: Medicare Other | Source: Ambulatory Visit | Attending: Cardiovascular Disease | Admitting: Cardiovascular Disease

## 2015-09-12 ENCOUNTER — Encounter
Admission: RE | Disposition: A | Discharge status: Home / Self Care | Payer: Self-pay | Source: Ambulatory Visit | Attending: Cardiovascular Disease

## 2015-09-12 ENCOUNTER — Other Ambulatory Visit: Payer: Self-pay

## 2015-09-12 ENCOUNTER — Encounter: Payer: Self-pay | Admitting: *Deleted

## 2015-09-12 DIAGNOSIS — I209 Angina pectoris, unspecified: Secondary | ICD-10-CM

## 2015-09-12 DIAGNOSIS — Z8249 Family history of ischemic heart disease and other diseases of the circulatory system: Secondary | ICD-10-CM | POA: Insufficient documentation

## 2015-09-12 DIAGNOSIS — I4891 Unspecified atrial fibrillation: Secondary | ICD-10-CM | POA: Diagnosis not present

## 2015-09-12 DIAGNOSIS — Z950 Presence of cardiac pacemaker: Secondary | ICD-10-CM | POA: Diagnosis not present

## 2015-09-12 DIAGNOSIS — I252 Old myocardial infarction: Secondary | ICD-10-CM | POA: Insufficient documentation

## 2015-09-12 DIAGNOSIS — Z9889 Other specified postprocedural states: Secondary | ICD-10-CM

## 2015-09-12 DIAGNOSIS — E785 Hyperlipidemia, unspecified: Secondary | ICD-10-CM | POA: Diagnosis not present

## 2015-09-12 DIAGNOSIS — I119 Hypertensive heart disease without heart failure: Secondary | ICD-10-CM | POA: Insufficient documentation

## 2015-09-12 DIAGNOSIS — I2510 Atherosclerotic heart disease of native coronary artery without angina pectoris: Secondary | ICD-10-CM | POA: Diagnosis not present

## 2015-09-12 DIAGNOSIS — Z87891 Personal history of nicotine dependence: Secondary | ICD-10-CM | POA: Diagnosis not present

## 2015-09-12 DIAGNOSIS — Z79899 Other long term (current) drug therapy: Secondary | ICD-10-CM | POA: Insufficient documentation

## 2015-09-12 DIAGNOSIS — Z7982 Long term (current) use of aspirin: Secondary | ICD-10-CM | POA: Diagnosis not present

## 2015-09-12 DIAGNOSIS — G4733 Obstructive sleep apnea (adult) (pediatric): Secondary | ICD-10-CM | POA: Diagnosis not present

## 2015-09-12 DIAGNOSIS — R0602 Shortness of breath: Secondary | ICD-10-CM | POA: Diagnosis present

## 2015-09-12 DIAGNOSIS — R0789 Other chest pain: Secondary | ICD-10-CM | POA: Diagnosis present

## 2015-09-12 DIAGNOSIS — T82855A Stenosis of coronary artery stent, initial encounter: Secondary | ICD-10-CM | POA: Diagnosis not present

## 2015-09-12 DIAGNOSIS — Z791 Long term (current) use of non-steroidal anti-inflammatories (NSAID): Secondary | ICD-10-CM | POA: Insufficient documentation

## 2015-09-12 DIAGNOSIS — Z881 Allergy status to other antibiotic agents status: Secondary | ICD-10-CM | POA: Insufficient documentation

## 2015-09-12 DIAGNOSIS — J449 Chronic obstructive pulmonary disease, unspecified: Secondary | ICD-10-CM | POA: Diagnosis not present

## 2015-09-12 HISTORY — PX: CARDIAC CATHETERIZATION: SHX172

## 2015-09-12 SURGERY — LEFT HEART CATH AND CORONARY ANGIOGRAPHY
Anesthesia: Moderate Sedation | Laterality: Bilateral

## 2015-09-12 SURGERY — LEFT HEART CATH AND CORONARY ANGIOGRAPHY
Anesthesia: Moderate Sedation | Laterality: Left

## 2015-09-12 MED ORDER — FENTANYL CITRATE (PF) 100 MCG/2ML IJ SOLN
INTRAMUSCULAR | Status: DC | PRN
  Administered 2015-09-12: 50 ug via INTRAVENOUS
  Administered 2015-09-12 (×2): 25 ug via INTRAVENOUS

## 2015-09-12 MED ORDER — SODIUM CHLORIDE 0.9 % WEIGHT BASED INFUSION
1.0000 mL/kg/h | INTRAVENOUS | Status: DC
  Administered 2015-09-12: 1 mL/kg/h via INTRAVENOUS

## 2015-09-12 MED ORDER — FENTANYL CITRATE (PF) 100 MCG/2ML IJ SOLN
INTRAMUSCULAR | Status: AC
  Filled 2015-09-12: qty 2

## 2015-09-12 MED ORDER — MIDAZOLAM HCL 2 MG/2ML IJ SOLN
INTRAMUSCULAR | Status: DC | PRN
  Administered 2015-09-12: 0.5 mg via INTRAVENOUS
  Administered 2015-09-12: 1 mg via INTRAVENOUS
  Administered 2015-09-12: 0.5 mg via INTRAVENOUS

## 2015-09-12 MED ORDER — MIDAZOLAM HCL 2 MG/2ML IJ SOLN
INTRAMUSCULAR | Status: AC
  Filled 2015-09-12: qty 2

## 2015-09-12 MED ORDER — HEPARIN (PORCINE) IN NACL 2-0.9 UNIT/ML-% IJ SOLN
INTRAMUSCULAR | Status: AC
  Filled 2015-09-12: qty 1000

## 2015-09-12 MED ORDER — SODIUM CHLORIDE 0.9 % WEIGHT BASED INFUSION: 3.0000 mL/kg/h | INTRAVENOUS | Status: DC

## 2015-09-12 MED ORDER — IOHEXOL 300 MG/ML  SOLN
INTRAMUSCULAR | Status: DC | PRN
  Administered 2015-09-12: 150 mL via INTRA_ARTERIAL

## 2015-09-12 MED ORDER — ASPIRIN 81 MG PO CHEW: 81.0000 mg | CHEWABLE_TABLET | ORAL | Status: DC

## 2015-09-12 SURGICAL SUPPLY — 11 items
CATH INFINITI 5FR ANG PIGTAIL (CATHETERS) ×2 IMPLANT
CATH INFINITI 5FR JL4 (CATHETERS) ×2 IMPLANT
CATH INFINITI JR4 5F (CATHETERS) ×2 IMPLANT
DEVICE CLOSURE MYNXGRIP 5F (Vascular Products) ×2 IMPLANT
KIT MANI 3VAL PERCEP (MISCELLANEOUS) ×2 IMPLANT
NEEDLE PERC 18GX7CM (NEEDLE) ×2 IMPLANT
NEEDLE SMART 18G ACCESS (NEEDLE) IMPLANT
PACK CARDIAC CATH (CUSTOM PROCEDURE TRAY) ×2 IMPLANT
SHEATH AVANTI 5FR X 11CM (SHEATH) ×2 IMPLANT
SHEATH PINNACLE 5F 10CM (SHEATH) ×2 IMPLANT
WIRE EMERALD 3MM-J .035X150CM (WIRE) ×2 IMPLANT

## 2015-09-12 NOTE — Discharge Instructions (Signed)

## 2015-09-15 ENCOUNTER — Telehealth: Payer: Self-pay

## 2015-09-15 NOTE — Telephone Encounter (Signed)
Spoke w/ Jose [name].  Advised him that he has an appt w/ Dr. Laneta SimmersBartle on 09/24/15 @ 12:30. He will receive a call from their office w/ more info.  He is appreciative of the call and will let us know if we can be of further assistance.

## 2015-09-15 NOTE — Telephone Encounter (Signed)
Pt is calling regarding a "surgery" we are supposed to be scheduling for him. He states it is bypass surgery. Please call.

## 2015-09-16 ENCOUNTER — Other Ambulatory Visit: Payer: Self-pay | Admitting: *Deleted

## 2015-09-16 ENCOUNTER — Encounter: Payer: Self-pay | Admitting: Surgery

## 2015-09-16 ENCOUNTER — Institutional Professional Consult (permissible substitution) (INDEPENDENT_AMBULATORY_CARE_PROVIDER_SITE_OTHER): Payer: Medicare Other | Admitting: Surgery

## 2015-09-16 VITALS — BP 140/91 | HR 80 | Resp 18 | Ht 73.0 in | Wt 237.0 lb

## 2015-09-16 DIAGNOSIS — I2510 Atherosclerotic heart disease of native coronary artery without angina pectoris: Secondary | ICD-10-CM

## 2015-09-16 NOTE — Progress Notes (Signed)
Cardiothoracic Surgery Consultation  PCP is Danella Penton., MD Referring Provider is Antonieta Iba, MD  Chief Complaint  Patient presents with  . Coronary Artery Disease    eval for CABG, cath done 12/16    HPI:  The patient is a 74 year old gentleman with a history of hypertension, hypercholesterolemia, hypothyroidism, sick sinus syndrome s/p PPM and coronary artery disease s/p MI in 2002 followed by stenting of his RCA. He had moderate disease in the LAD and LCX at that time. He had a cath in 2008 and the stent was reportedly patent. He now presents with fatigue and shortness of breath with exertion over the past several months. He has had no chest pain or pressure. He does report some numbness and pain in his arms but it usually occurs at night while sleeping and he has been told that he has bilateral carpal tunnel. He had a stress test showing:   There was no ST segment deviation noted during stress.  Defect 1: There is a defect present in the apex location.  There is a fixed apical defect with no reversibility. This could be due to an artifact given intense GI uptake affecting the quality of study. However, prior apical infarct can not be excluded.  The left ventricular ejection fraction is moderately decreased (30-44%). Calculated EF was 26% but there was poor tracking  This is an intermediate risk study.  Correlate clinically and recommend an echo to better evaluate LVSF.   An echo showed an EF of 40-45% with probable severe hypokinesis of the mid-apicalanterior, anterolateral, inferoseptal, and apical Myocardium. There was no AS or AI and no MR.  Cardiac cath on 09/12/2015 showed severe 3-vessel coronary disease:   Mid RCA lesion, 85% stenosed. The lesion was previously treated with a stent (unknown type).  Mid Cx lesion, 80% stenosed.  LM lesion, 80% stenosed.  1st Diag lesion, 90% stenosed.  Prox LAD lesion, 60% stenosed   Past Medical History    Diagnosis Date  . Hypertension   . Pacemaker   . Hypercholesteremia   . Hypothyroidism   . MI (myocardial infarction) (HCC)   . Coronary artery disease 2002    RCA stent, 2002. 50-60% stenosis of proximal LAD, 75% stenosis OM2, patent stent mid RCA by cardiac catheterization, 04/17/07    Past Surgical History  Procedure Laterality Date  . Hammer toe surgery    . Total knee arthroplasty      right  . Shoulder surgery    . Cardiac catheterization    . Coronary angioplasty with stent placement    . Lobectomy for benign lung nodule      upper right   . Insert / replace / remove pacemaker  02/25/2015    Serial # [id number] H Model # [id number]/ [id number]  . Cardiac catheterization Left 09/12/2015    Procedure: Left Heart Cath and Coronary Angiography;  Surgeon: Antonieta Iba, MD;  Location: [facility] INVASIVE CV LAB;  Service: Cardiovascular;  Laterality: Left;    Family History  Problem Relation Age of Onset  . Heart attack Mother 61    Social History Social History  Substance Use Topics  . Smoking status: Former Smoker -- 1.00 packs/day for 25 years    Types: Cigarettes    Quit date: 10/29/1964  . Smokeless tobacco: None  . Alcohol Use: Yes    Current Outpatient Prescriptions  Medication Sig Dispense Refill  . aspirin 81 MG chewable tablet Take 81 mg by mouth daily.    Marland Kitchen  latanoprost (XALATAN) 0.005 % ophthalmic solution     . levothyroxine (SYNTHROID, LEVOTHROID) 25 MCG tablet Take 25 mcg by mouth daily before breakfast.     . meloxicam (MOBIC) 15 MG tablet Take 15 mg by mouth daily.     . metoprolol succinate (TOPROL-XL) 25 MG 24 hr tablet Take 25 mg by mouth daily.     . simvastatin (ZOCOR) 40 MG tablet Take 40 mg by mouth daily at 6 PM.      No current facility-administered medications for this visit.    Allergies  Allergen Reactions  . Amoxicillin Nausea Only  . Amoxicillin-Pot Clavulanate Other (See Comments)    Patient reports he took oral Augmentin after a foot  surgery, and it caused severe nausea; however reports that he can take 'plain' amoxicillin; he takes amoxicillin prior to any dentist procedures, and has on hand    Review of Systems  Constitutional: Positive for fatigue. Negative for activity change and appetite change.  HENT: Negative.   Eyes: Negative.   Respiratory: Positive for apnea, cough and shortness of breath.   Cardiovascular: Negative for chest pain, palpitations and leg swelling.  Gastrointestinal: Negative.   Endocrine: Negative.   Genitourinary: Negative.   Musculoskeletal: Positive for arthralgias.  Skin: Negative.   Allergic/Immunologic: Negative.   Neurological: Positive for numbness.  Hematological: Negative.   Psychiatric/Behavioral: Negative.     BP 140/91 mmHg  Pulse 80  Resp 18  Ht 6\' 1"  (1.854 m)  Wt 237 lb (107.502 kg)  BMI 31.27 kg/m2  SpO2 98% Physical Exam  Constitutional: He is oriented to person, place, and time. He appears well-developed and well-nourished. No distress.  HENT:  Head: Normocephalic and atraumatic.  Mouth/Throat: Oropharynx is clear and moist.  Eyes: EOM are normal. Pupils are equal, round, and reactive to light.  Neck: Normal range of motion. Neck supple. No JVD present. No thyromegaly present.  Cardiovascular: Normal rate, regular rhythm, normal heart sounds and intact distal pulses.   No murmur heard. Pulmonary/Chest: Effort normal and breath sounds normal. No respiratory distress. He has no rales.  Right thoracotomy scar  Abdominal: Soft. Bowel sounds are normal. He exhibits no distension and no mass. There is no tenderness.  Musculoskeletal: Normal range of motion. He exhibits no edema.  Lymphadenopathy:    He has no cervical adenopathy.  Neurological: He is alert and oriented to person, place, and time. He has normal strength. No cranial nerve deficit.  Skin: Skin is warm and dry.  Psychiatric: He has a normal mood and affect.     Diagnostic Tests:     [facility]         [address]            [place], [place] [address]              [phone]  ------------------------------------------------------------------- Transthoracic Echocardiography  Patient:  Honor, Fairbank MR #:    [id number] Study Date: 06/20/2015 Gender:   M Age:    35 Height:   185.4 cm Weight:   115.2 kg BSA:    2.47 m^2 Pt. Status: Room:  ATTENDING  Default, Provider [phone] Ernst Bowler, Tim REFERRING  [place], Tim PERFORMING  [place], [place] SONOGRAPHER Quentin Ore, RVT, RDCS, RDMS  cc:  ------------------------------------------------------------------- LV EF: 40% -  45%  ------------------------------------------------------------------- History:  PMH: Obstructive sleep apnea. Coronary artery disease. Risk factors: Former tobacco use. Dyslipidemia.  ------------------------------------------------------------------- Study Conclusions  - Left ventricle: The cavity  size was normal. There was mild concentric hypertrophy. Systolic function was mildly to moderately reduced. The estimated ejection fraction was in the range of 40% to 45%. Probable severe hypokinesis of the mid-apicalanterior, anterolateral, inferoseptal, and apical myocardium. Doppler parameters are consistent with abnormal left ventricular relaxation (grade 1 diastolic dysfunction). - Left atrium: The atrium was mildly dilated. - Right atrium: The atrium was mildly dilated.  ------------------------------------------------------------------- Labs, prior tests, procedures, and surgery: Permanent pacemaker system implantation.  ECG.   Abnormal. Transthoracic echocardiography. M-mode, complete 2D, spectral Doppler, and color Doppler. Birthdate: Patient birthdate: 1941/01/18. Age: Patient is 74 yr old. Sex: Gender: male. BMI: 33.5 kg/m^2. Blood pressure:   112/68  Patient status: Outpatient. Study date: Study date: 06/20/2015. Study time: 11:39 AM.  -------------------------------------------------------------------  ------------------------------------------------------------------- Left ventricle: The cavity size was normal. There was mild concentric hypertrophy. Systolic function was mildly to moderately reduced. The estimated ejection fraction was in the range of 40% to 45%. Regional wall motion abnormalities:  Probable severe hypokinesis of the mid-apicalanterior, anterolateral, inferoseptal, and apical myocardium. Doppler parameters are consistent with abnormal left ventricular relaxation (grade 1 diastolic dysfunction).  ------------------------------------------------------------------- Aortic valve:  Trileaflet; normal thickness leaflets. Mobility was not restricted. Doppler: Transvalvular velocity was within the normal range. There was no stenosis. There was no regurgitation.  ------------------------------------------------------------------- Aorta: Aortic root: The aortic root was normal in size.  ------------------------------------------------------------------- Mitral valve:  Structurally normal valve.  Mobility was not restricted. Doppler: Transvalvular velocity was within the normal range. There was no evidence for stenosis. There was no regurgitation.  ------------------------------------------------------------------- Left atrium: The atrium was mildly dilated.  ------------------------------------------------------------------- Right ventricle: The cavity size was normal. Wall thickness was normal. Systolic function was normal.  ------------------------------------------------------------------- Pulmonic valve:  Doppler: Transvalvular velocity was within the normal range. There was no evidence for stenosis.  ------------------------------------------------------------------- Tricuspid valve:   Structurally normal valve.  Doppler: Transvalvular velocity was within the normal range. There was no regurgitation.  ------------------------------------------------------------------- Pulmonary artery:  The main pulmonary artery was normal-sized. Systolic pressure could not be accurately estimated.  ------------------------------------------------------------------- Right atrium: The atrium was mildly dilated.  ------------------------------------------------------------------- Pericardium: There was no pericardial effusion.  ------------------------------------------------------------------- Systemic veins: Inferior vena cava: The vessel was normal in size.  ------------------------------------------------------------------- Measurements  Left ventricle             Value    Reference LV ID, ED, PLAX chordal        43.7 mm   43 - 52 LV PW thickness, ED          11.7 mm   --------- IVS/LV PW ratio, ED          0.97     <=1.3 Stroke volume, 2D           89  ml   --------- Stroke volume/bsa, 2D         36  ml/m^2 --------- LV e&', lateral             4.58 cm/s  --------- LV E/e&', lateral            13.25    --------- LV e&', medial             6.34 cm/s  --------- LV E/e&', medial            9.57     --------- LV e&', average             5.46 cm/s  --------- LV E/e&', average  11.12    ---------  Ventricular septum           Value    Reference IVS thickness, ED           11.4 mm   ---------  LVOT                  Value    Reference LVOT ID, S               24  mm   --------- LVOT area               4.52 cm^2  --------- LVOT ID                24  mm   --------- LVOT peak  velocity, S         95.5 cm/s  --------- LVOT mean velocity, S         64.3 cm/s  --------- LVOT VTI, S              19.6 cm   --------- LVOT peak gradient, S         4   mm Hg --------- Stroke volume (SV), LVOT DP      88.7 ml   --------- Stroke index (SV/bsa), LVOT DP     35.9 ml/m^2 ---------  Aorta                 Value    Reference Aortic root ID, ED           38  mm   --------- Ascending aorta ID, A-P, S       35  mm   ---------  Left atrium              Value    Reference LA ID, A-P, ES             43  mm   --------- LA ID/bsa, A-P             1.74 cm/m^2 <=2.2 LA volume, S              93  ml   --------- LA volume/bsa, S            37.6 ml/m^2 --------- LA volume, ES, 1-p A4C         90  ml   --------- LA volume/bsa, ES, 1-p A4C       36.4 ml/m^2 --------- LA volume, ES, 1-p A2C         90  ml   --------- LA volume/bsa, ES, 1-p A2C       36.4 ml/m^2 ---------  Mitral valve              Value    Reference Mitral E-wave peak velocity      60.7 cm/s  --------- Mitral A-wave peak velocity      88.4 cm/s  --------- Mitral deceleration time    (H)   292  ms   150 - 230 Mitral E/A ratio, peak         0.7     ---------  Legend: (L) and (H) mark values outside specified reference range.  ------------------------------------------------------------------- Prepared and Electronically Authenticated by  Lorine Bears, MD 2016-09-25T08:43:26 Impression:  Physicians    Panel Physicians Referring Physician Case Authorizing Physician   Antonieta Iba, MD (Primary)      Procedures    Left Heart Cath and Coronary Angiography    Conclusion  Mid RCA lesion, 85% stenosed.  The lesion was previously treated with a stent (unknown type).  Mid Cx lesion, 80% stenosed.  LM lesion, 80% stenosed.  1st Diag lesion, 90% stenosed.  Prox LAD lesion, 60% stenosed.     Technique and Indications    Cardiac Catheterization Procedure Note  Name: Jose [name] MRN: [id number] DOB: December 15, 1940  Procedure: Left Heart Cath, Selective Coronary Angiography, LV angiography  Indication:  74 year old gentleman with a history of coronary artery disease, stent to his RCA in 2002 at the time of an MI, notes detailing 50-60% proximal LAD disease, 75% OM 2 disease, last catheterization July 2008 at which time the RCA was reportedly patent, hyperlipidemia, prior smoking history from age 79 up to 42, sick sinus syndrome, bradycardia with heart rate in the 30s, general malaise and shortness of breath, admission to [facility] for Medtronic pacemaker, dual-chamber,  History of lobectomy for lesion in his lung, benign growth  Seen in the clinic for worsening shortness of breath, mild chest discomfort, over the past several months, even prior to placement of his pacemaker over the summer. sweating more with exertion, increasing shortness of breath with activities. Still able to do some activities but more symptomatic.   Stress test showed apical perfusion defect, depressed ejection fraction Echocardiogram showed decreased ejection fraction from prior down to 40-45% with regions of wall motion abnormality  Other past medical history he was at [facility] over the summer 2016 when he developed malaise, shortness of breath. He went to the emergency room and was found to have heart rate in the 30s. Pacemaker was placed at [facility] without complication. No ischemia workup at that time  He presents today for catheterization to rule out ischemia given his worsening symptoms as above   Procedural details: The right groin was prepped, draped, and anesthetized with 1% lidocaine.  Using modified Seldinger technique, a 5 French sheath was introduced into the right femoral artery. Standard Judkins catheters were used for coronary angiography and left ventriculography. Catheter exchanges were performed over a guidewire. There were no immediate procedural complications. The patient was transferred to the post catheterization recovery area for further monitoring.  Procedural Findings:  Coronary angiography:  Coronary dominance: Right  Left mainstem: Large vessel bifurcates into the LAD and left circumflex, no significant disease noted  Left anterior descending (LAD): Large vessel that extends past the apical region. There is severe long region of calcified rock small LAD disease estimated at 70-80%, also with 60% LAD after the takeoff of the diagonal. The diagonal itself is moderate size vessel with 90% proximal disease, focal lesion.  Left circumflex (LCx): Moderate to large size vessel with 80-90% mid left circumflex disease, several OM vessels, mild luminal irregularities  Right coronary artery (RCA): Moderate to large size vessel with mid RCA stent with in-stent restenosis, focal lesion 80%  Left ventriculography: Left ventricular systolic function is normal, LVEF is estimated at 50%, there is no significant mitral regurgitation or aortic valve stenosis. There appears to be apical, inferior apical and anteroapical hypokinesis  Final Conclusions:  Severe three-vessel disease. Discussed with Dr. Kirke Corin,  It is felt best option may be bypass surgery. He is a good candidate, mild COPD. May benefit from LIMA to the LAD, vein graft to diagonal, OM, RCA. Results were discussed with the patient  Recommendations:  Consultation request has been placed to meet with CT surgery as an outpatient. We have recommended he not wait on his surgery, try to have this  ASAP. He was planning on going to [place] in late January but understands the need to address this first  Julien Nordmann 09/12/2015, 1:35 PM   Estimated blood loss <50 mL. There were no immediate complications during the procedure.    Coronary Findings    Dominance: Co-dominant   Left Main   . LM lesion, 80% stenosed. Severely Calcified.     Left Anterior Descending   . Prox LAD lesion, 60% stenosed.   . First Diagonal Branch   . 1st Diag lesion, 90% stenosed. Discrete.     Left Circumflex   . Mid Cx lesion, 80% stenosed.     Right Coronary Artery   . Mid RCA lesion, 85% stenosed. Discrete. The lesion was previously treated with a stent (unknown type).      Wall Motion                 Coronary Diagrams    Diagnostic Diagram            Implants    Name ID Temporary Type Supply   DEVICE CLOSURE MYNXGRIP 36F - [id number] No Vascular Products DEVICE CLOSURE MYNXGRIP 36F    PACS Images    Show images for Cardiac catheterization     Link to Procedure Log    Procedure Log      Hemo Data    AO Systolic Cath Pressure AO Diastolic Cath Pressure AO Mean Cath Pressure LV Systolic Cath Pressure LV End Diastolic   -- -- -- 139 mmHg 16 mmHg   -- -- -- 132 mmHg 16 mmHg   -- -- -- 141 mmHg 20 mmHg   143 67 mmHg 94 mmHg -- --     Impression:  He has severe 3- vessel coronary artery disease with moderate LV dysfunction and a markedly abnormal stress test presenting with progressive exertional fatigue and shortness of breath. I agree that CABG is the best treatment for him for relief of symptoms and survival benefit. I discussed the operative procedure with the patient and his wife including alternatives, benefits and risks; including but not limited to bleeding, blood transfusion, infection, stroke, myocardial infarction, graft failure, heart block requiring a permanent pacemaker, organ dysfunction, and death.  Merlinda Frederick understands and agrees to proceed.     Plan:  We will schedule surgery for next Tuesday 09/23/2015  Alleen Borne, MD [facility] [phone]

## 2015-09-18 ENCOUNTER — Other Ambulatory Visit (HOSPITAL_COMMUNITY): Payer: Self-pay | Admitting: *Deleted

## 2015-09-18 ENCOUNTER — Ambulatory Visit (HOSPITAL_COMMUNITY)
Admission: RE | Admit: 2015-09-18 | Discharge: 2015-09-18 | Disposition: A | Discharge status: Home / Self Care | Payer: Medicare Other | Source: Ambulatory Visit | Attending: Surgery | Admitting: Surgery

## 2015-09-18 ENCOUNTER — Encounter (HOSPITAL_COMMUNITY): Payer: Self-pay

## 2015-09-18 ENCOUNTER — Encounter (HOSPITAL_COMMUNITY)
Admission: RE | Admit: 2015-09-18 | Discharge: 2015-09-18 | Disposition: A | Discharge status: Home / Self Care | Payer: Medicare Other | Source: Ambulatory Visit | Attending: Surgery | Admitting: Surgery

## 2015-09-18 VITALS — BP 122/90 | HR 69 | Temp 98.0°F | Resp 18 | Ht 73.0 in | Wt 242.4 lb

## 2015-09-18 DIAGNOSIS — I252 Old myocardial infarction: Secondary | ICD-10-CM | POA: Insufficient documentation

## 2015-09-18 DIAGNOSIS — Z87891 Personal history of nicotine dependence: Secondary | ICD-10-CM | POA: Diagnosis not present

## 2015-09-18 DIAGNOSIS — I10 Essential (primary) hypertension: Secondary | ICD-10-CM | POA: Diagnosis not present

## 2015-09-18 DIAGNOSIS — Z0183 Encounter for blood typing: Secondary | ICD-10-CM | POA: Insufficient documentation

## 2015-09-18 DIAGNOSIS — E7800 Pure hypercholesterolemia, unspecified: Secondary | ICD-10-CM | POA: Insufficient documentation

## 2015-09-18 DIAGNOSIS — E039 Hypothyroidism, unspecified: Secondary | ICD-10-CM | POA: Insufficient documentation

## 2015-09-18 DIAGNOSIS — I2510 Atherosclerotic heart disease of native coronary artery without angina pectoris: Secondary | ICD-10-CM | POA: Insufficient documentation

## 2015-09-18 DIAGNOSIS — Z955 Presence of coronary angioplasty implant and graft: Secondary | ICD-10-CM | POA: Diagnosis not present

## 2015-09-18 DIAGNOSIS — Z01812 Encounter for preprocedural laboratory examination: Secondary | ICD-10-CM | POA: Insufficient documentation

## 2015-09-18 DIAGNOSIS — Z79899 Other long term (current) drug therapy: Secondary | ICD-10-CM | POA: Insufficient documentation

## 2015-09-18 DIAGNOSIS — Z902 Acquired absence of lung [part of]: Secondary | ICD-10-CM | POA: Diagnosis not present

## 2015-09-18 DIAGNOSIS — R9431 Abnormal electrocardiogram [ECG] [EKG]: Secondary | ICD-10-CM | POA: Diagnosis not present

## 2015-09-18 DIAGNOSIS — Z950 Presence of cardiac pacemaker: Secondary | ICD-10-CM | POA: Diagnosis not present

## 2015-09-18 DIAGNOSIS — Z7982 Long term (current) use of aspirin: Secondary | ICD-10-CM | POA: Insufficient documentation

## 2015-09-18 DIAGNOSIS — Z01818 Encounter for other preprocedural examination: Secondary | ICD-10-CM | POA: Insufficient documentation

## 2015-09-18 HISTORY — DX: Headache, unspecified: R51.9

## 2015-09-18 HISTORY — DX: Acquired absence of lung (part of): Z90.2

## 2015-09-18 HISTORY — DX: Headache: R51

## 2015-09-18 LAB — PULMONARY FUNCTION TEST
DL/VA % PRED: 90 %
DL/VA: 4.15 ml/min/mmHg/L
DLCO unc % pred: 78 %
DLCO unc: 25.29 ml/min/mmHg
FEF 25-75 POST: 1.81 L/s
FEF 25-75 Pre: 1.05 L/sec
FEF2575-%CHANGE-POST: 72 %
FEF2575-%PRED-PRE: 46 %
FEF2575-%Pred-Post: 80 %
FEV1-%Change-Post: 14 %
FEV1-%Pred-Post: 88 %
FEV1-%Pred-Pre: 77 %
FEV1-PRE: 2.39 L
FEV1-Post: 2.72 L
FEV1FVC-%CHANGE-POST: 0 %
FEV1FVC-%PRED-PRE: 85 %
FEV6-%Change-Post: 13 %
FEV6-%Pred-Post: 101 %
FEV6-%Pred-Pre: 88 %
FEV6-Post: 4.02 L
FEV6-Pre: 3.53 L
FEV6FVC-%Change-Post: 0 %
FEV6FVC-%Pred-Post: 97 %
FEV6FVC-%Pred-Pre: 98 %
FVC-%Change-Post: 14 %
FVC-%PRED-PRE: 89 %
FVC-%Pred-Post: 102 %
FVC-POST: 4.37 L
FVC-PRE: 3.82 L
PRE FEV1/FVC RATIO: 63 %
Post FEV1/FVC ratio: 62 %
Post FEV6/FVC ratio: 92 %
Pre FEV6/FVC Ratio: 93 %
RV % pred: 98 %
RV: 2.52 L
TLC % PRED: 93 %
TLC: 6.55 L

## 2015-09-18 LAB — URINALYSIS, ROUTINE W REFLEX MICROSCOPIC
BILIRUBIN URINE: NEGATIVE
Glucose, UA: NEGATIVE mg/dL
HGB URINE DIPSTICK: NEGATIVE
KETONES UR: NEGATIVE mg/dL
Leukocytes, UA: NEGATIVE
Nitrite: NEGATIVE
PROTEIN: NEGATIVE mg/dL
Specific Gravity, Urine: 1.019 (ref 1.005–1.030)
pH: 6 (ref 5.0–8.0)

## 2015-09-18 LAB — CBC
HEMATOCRIT: 44 % (ref 39.0–52.0)
Hemoglobin: 15.1 g/dL (ref 13.0–17.0)
MCH: 33.2 pg (ref 26.0–34.0)
MCHC: 34.3 g/dL (ref 30.0–36.0)
MCV: 96.7 fL (ref 78.0–100.0)
PLATELETS: 174 10*3/uL (ref 150–400)
RBC: 4.55 MIL/uL (ref 4.22–5.81)
RDW: 13.6 % (ref 11.5–15.5)
WBC: 5.9 10*3/uL (ref 4.0–10.5)

## 2015-09-18 LAB — COMPREHENSIVE METABOLIC PANEL
ALK PHOS: 47 U/L (ref 38–126)
ALT: 32 U/L (ref 17–63)
ANION GAP: 11 (ref 5–15)
AST: 25 U/L (ref 15–41)
Albumin: 3.8 g/dL (ref 3.5–5.0)
BUN: 13 mg/dL (ref 6–20)
CALCIUM: 9.7 mg/dL (ref 8.9–10.3)
CO2: 23 mmol/L (ref 22–32)
Chloride: 102 mmol/L (ref 101–111)
Creatinine, Ser: 0.77 mg/dL (ref 0.61–1.24)
GFR calc Af Amer: >60 mL/min (ref >60)
GFR calc non Af Amer: >60 mL/min (ref >60)
GLUCOSE: 121 mg/dL — AB (ref 65–99)
POTASSIUM: 4.3 mmol/L (ref 3.5–5.1)
Sodium: 136 mmol/L (ref 135–145)
Total Bilirubin: 0.5 mg/dL (ref 0.3–1.2)
Total Protein: 6.9 g/dL (ref 6.5–8.1)

## 2015-09-18 LAB — BLOOD GAS, ARTERIAL
ACID-BASE EXCESS: 1.6 mmol/L (ref 0.0–2.0)
Bicarbonate: 25.6 mEq/L — ABNORMAL HIGH (ref 20.0–24.0)
Drawn by: 421801
FIO2: 0.21
O2 Saturation: 97.7 %
PCO2 ART: 39.4 mmHg (ref 35.0–45.0)
PH ART: 7.428 (ref 7.350–7.450)
PO2 ART: 96.6 mmHg (ref 80.0–100.0)
Patient temperature: 98.6
TCO2: 26.8 mmol/L (ref 0–100)

## 2015-09-18 LAB — ABO/RH: ABO/RH(D): B POS

## 2015-09-18 LAB — SURGICAL PCR SCREEN
MRSA, PCR: NEGATIVE
STAPHYLOCOCCUS AUREUS: NEGATIVE

## 2015-09-18 LAB — APTT: APTT: 28 s (ref 24–37)

## 2015-09-18 LAB — PROTIME-INR
INR: 1.09 (ref 0.00–1.49)
Prothrombin Time: 14.3 seconds (ref 11.6–15.2)

## 2015-09-18 LAB — TYPE AND SCREEN
ABO/RH(D): B POS
ANTIBODY SCREEN: NEGATIVE

## 2015-09-18 MED ORDER — ALBUTEROL SULFATE (2.5 MG/3ML) 0.083% IN NEBU
2.5000 mg | INHALATION_SOLUTION | Freq: Once | RESPIRATORY_TRACT | Status: AC
  Administered 2015-09-18: 2.5 mg via RESPIRATORY_TRACT

## 2015-09-18 NOTE — Progress Notes (Signed)
Anesthesia Chart Review: Patient is a 74 year old male scheduled for CABG on 09/23/15 by Dr. Laneta SimmersBartle.  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    PCP is Dr. Bethann PunchesMark Miller. Cardiologist is Dr. Julien Nordmannimothy Gollan. EP cardiologist is Dr. Sherryl MangesSteven Klein.  Meds include ASA 81mg , Xalatan ophthalmic, levothyroxine, Toprol, Zocor.  09/12/15 Cardiac cath (done due to intermediate risk stress, EF 30-44%, calculated EF 26% but poor tracking): - Mid RCA lesion, 85% stenosed. The lesion was previously treated with a stent (unknown type) - Mid Cx lesion, 80% stenosed. - LM lesion, 80% stenosed. - 1st Diag lesion, 90% stenosed. - Prox LAD lesion, 60% stenosed  06/20/15 Echo: Study Conclusions - Left ventricle: The cavity size was normal. There was mild concentric hypertrophy. Systolic function was mildly to moderately reduced. The estimated ejection fraction was in the range of 40% to 45%. Probable severe hypokinesis of the mid-apicalanterior, anterolateral, inferoseptal, and apical myocardium. Doppler parameters are consistent with abnormal left ventricular relaxation (grade 1 diastolic dysfunction). - Left atrium: The atrium was mildly dilated. - Right atrium: The atrium was mildly dilated.  09/18/15 EKG: AV paced rhythm.  09/18/15 CXR: IMPRESSION: Chronic bronchitic changes. There is no pneumonia, CHF, nor other acute cardiopulmonary disease.  09/18/15 PFTs: FVC 3.82 (89%), FEV1 2.39 (77%), DLCOunc 25.29 (78%).  Preoperative labs noted. A1C pending.   Carotid dopplers are scheduled for 09/19/15.  Velna Ochsllison Briahnna Harries, PA-C [facility]/Anesthesiology Phone [phone] 09/18/2015 5:36 PM

## 2015-09-18 NOTE — Pre-Procedure Instructions (Addendum)
Jose FrederickMac D Contee  09/18/2015    Your procedure is scheduled on Tuesday, September 23, 2015 at 7:30 AM.   [facility] Entr[facility] "A" Admitting Office at 5:30 AM.   Call this number if you have problems the morning of surgery: [phone]   Any questions prior to day of surgery, please call [phone] between 8 & 4 PM.   Remember:  Do not eat food or drink liquids after midnight Monday, 09/22/15.  Take these medicines the morning of surgery with A SIP OF WATER: Levothyroxine (Synthroid), Metoprolol (Toprol XL)  Stop Multivitamins and Mobic (Meloxicam) as of today.   Do not wear jewelry.  Do not wear lotions, powders, or cologne.  You may NOT wear deodorant.  Men may shave face and neck.  Do not bring valuables to the hospital.  [facility] is not responsible for any belongings or valuables.  Contacts, dentures or bridgework may not be worn into surgery.  Leave your suitcase in the car.  After surgery it may be brought to your room.  For patients admitted to the hospital, discharge time will be determined by your treatment team.  Special instructions:  [facility] - Preparing for Surgery  Before surgery, you can play an important role.  Because skin is not sterile, your skin needs to be as free of germs as possible.  You can reduce the number of germs on you skin by washing with CHG (chlorahexidine gluconate) soap before surgery.  CHG is an antiseptic cleaner which kills germs and bonds with the skin to continue killing germs even after washing.  Please DO NOT use if you have an allergy to CHG or antibacterial soaps.  If your skin becomes reddened/irritated stop using the CHG and inform your nurse when you arrive at Short Stay.  Do not shave (including legs and underarms) for at least 48 hours prior to the first CHG shower.  You may shave your face.  Please follow these instructions carefully:   1.  Shower with CHG Soap the night before surgery and the                                 morning of Surgery.  2.  If you choose to wash your hair, wash your hair first as usual with your       normal shampoo.  3.  After you shampoo, rinse your hair and body thoroughly to remove the                      Shampoo.  4.  Use CHG as you would any other liquid soap.  You can apply chg directly       to the skin and wash gently with scrungie or a clean washcloth.  5.  Apply the CHG Soap to your body ONLY FROM THE NECK DOWN.        Do not use on open wounds or open sores.  Avoid contact with your eyes, ears, mouth and genitals (private parts).  Wash genitals (private parts) with your normal soap.  6.  Wash thoroughly, paying special attention to the area where your surgery        will be performed.  7.  Thoroughly rinse your body with warm water from the neck down.  8.  DO NOT shower/wash with your normal soap after using and rinsing off  the CHG Soap.  9.  Pat yourself dry with a clean towel.            10.  Wear clean pajamas.            11.  Place clean sheets on your bed the night of your first shower and do not        sleep with pets.  Day of Surgery  Do not apply any lotions/deodorants the morning of surgery.  Please wear clean clothes to the hospital.   Please read over the following fact sheets that you were given. Pain Booklet, Coughing and Deep Breathing, Blood Transfusion Information, Open Heart Packet, MRSA Information and Surgical Site Infection Prevention

## 2015-09-18 NOTE — Progress Notes (Signed)
Pt is followed by cardiologist, Dr. Mariah MillingGollan for CAD and pacemaker. Had pacemaker put in this past summer. Pt denies any chest pain or sob at appt. States he can easily get sob with exertion.   ECHO - 06/20/15 in EPIC Stress - 06/13/15 in EPIC Cath - 09/12/15 in EPIC  Positive Stop Bang Assessment sent to PCP.  [employer] rep paged.

## 2015-09-18 NOTE — Progress Notes (Signed)
[employer] rep, Leta JunglingMarcia notified of pt's surgery and time of surgery.

## 2015-09-18 NOTE — Progress Notes (Signed)
   09/18/15 1030  OBSTRUCTIVE SLEEP APNEA  Have you ever been diagnosed with sleep apnea through a sleep study? No  Do you snore loudly (loud enough to be heard through closed doors)?  1  Do you often feel tired, fatigued, or sleepy during the daytime (such as falling asleep during driving or talking to someone)? 1  Has anyone observed you stop breathing during your sleep? 1  Do you have, or are you being treated for high blood pressure? 1  BMI more than 35 kg/m2? 0  Age > 50 (1-yes) 1  Neck circumference greater than:Male 16 inches or larger, Male 17inches or larger? 1  Male Gender (Yes=1) 1  Obstructive Sleep Apnea Score 7  Score 5 or greater  Results sent to PCP

## 2015-09-19 ENCOUNTER — Ambulatory Visit (HOSPITAL_COMMUNITY)
Admission: RE | Admit: 2015-09-19 | Discharge: 2015-09-19 | Disposition: A | Discharge status: Home / Self Care | Payer: Medicare Other | Source: Ambulatory Visit | Attending: Surgery | Admitting: Surgery

## 2015-09-19 DIAGNOSIS — I2510 Atherosclerotic heart disease of native coronary artery without angina pectoris: Secondary | ICD-10-CM | POA: Insufficient documentation

## 2015-09-19 DIAGNOSIS — Z01818 Encounter for other preprocedural examination: Secondary | ICD-10-CM | POA: Insufficient documentation

## 2015-09-19 DIAGNOSIS — I6523 Occlusion and stenosis of bilateral carotid arteries: Secondary | ICD-10-CM | POA: Insufficient documentation

## 2015-09-19 LAB — HEMOGLOBIN A1C
HEMOGLOBIN A1C: 6 % — AB (ref 4.8–5.6)
MEAN PLASMA GLUCOSE: 126 mg/dL

## 2015-09-19 NOTE — Progress Notes (Signed)
VASCULAR LAB PRELIMINARY  PRELIMINARY  PRELIMINARY  PRELIMINARY  Pre-op Cardiac Surgery  Carotid Findings: Bilateral: 1-39% ICA stenosis.    Upper Extremity Right Left  Brachial Pressures 128 Triphasic 126 Triphasic  Radial Waveforms Triphasic Triphasic  Ulnar Waveforms Triphasic Triphasic  Palmar Arch (Allen's Test) Normal Normal   Findings:  Doppler waveforms remained normal bilaterally with both radial and ulnar compressions.    Lower  Extremity Right Left  Dorsalis Pedis 167 Triphasic 159 Triphasic  Posterior Tibial 158 Triphasic 135 Triphasic  Ankle/Brachial Indices 1.3 1.24    Findings:  ABIs and Doppler waveforms are within normal limits bilaterally at rest.   Merrel Crabbe, RVS 09/19/2015, 3:28 PM

## 2015-09-22 MED ORDER — SODIUM CHLORIDE 0.9 % IV SOLN
INTRAVENOUS | Status: DC
  Filled 2015-09-22: qty 30

## 2015-09-22 MED ORDER — SODIUM CHLORIDE 0.9 % IV SOLN
INTRAVENOUS | Status: AC
  Administered 2015-09-23: 1 [IU]/h via INTRAVENOUS
  Filled 2015-09-22: qty 2.5

## 2015-09-22 MED ORDER — POTASSIUM CHLORIDE 2 MEQ/ML IV SOLN
80.0000 meq | INTRAVENOUS | Status: DC
  Filled 2015-09-22: qty 40

## 2015-09-22 MED ORDER — PHENYLEPHRINE HCL 10 MG/ML IJ SOLN
30.0000 ug/min | INTRAVENOUS | Status: AC
  Administered 2015-09-23: 15 ug/min via INTRAVENOUS
  Filled 2015-09-22: qty 2

## 2015-09-22 MED ORDER — DEXTROSE 5 % IV SOLN
750.0000 mg | INTRAVENOUS | Status: DC
  Filled 2015-09-22: qty 750

## 2015-09-22 MED ORDER — DOPAMINE-DEXTROSE 3.2-5 MG/ML-% IV SOLN
0.0000 ug/kg/min | INTRAVENOUS | Status: DC
  Filled 2015-09-22: qty 250

## 2015-09-22 MED ORDER — MAGNESIUM SULFATE 50 % IJ SOLN
40.0000 meq | INTRAMUSCULAR | Status: DC
  Filled 2015-09-22: qty 10

## 2015-09-22 MED ORDER — SODIUM CHLORIDE 0.9 % IV SOLN
INTRAVENOUS | Status: AC
  Administered 2015-09-23: 69.8 mL/h via INTRAVENOUS
  Filled 2015-09-22: qty 40

## 2015-09-22 MED ORDER — NITROGLYCERIN IN D5W 200-5 MCG/ML-% IV SOLN
2.0000 ug/min | INTRAVENOUS | Status: AC
  Administered 2015-09-23: 5 ug/min via INTRAVENOUS
  Filled 2015-09-22: qty 250

## 2015-09-22 MED ORDER — EPINEPHRINE HCL 1 MG/ML IJ SOLN
0.0000 ug/min | INTRAMUSCULAR | Status: DC
  Filled 2015-09-22: qty 4

## 2015-09-22 MED ORDER — DEXMEDETOMIDINE HCL IN NACL 400 MCG/100ML IV SOLN
0.1000 ug/kg/h | INTRAVENOUS | Status: AC
  Administered 2015-09-23: .3 ug/kg/h via INTRAVENOUS
  Filled 2015-09-22: qty 100

## 2015-09-22 MED ORDER — DEXTROSE 5 % IV SOLN
1.5000 g | INTRAVENOUS | Status: AC
  Administered 2015-09-23: 1.5 g via INTRAVENOUS
  Administered 2015-09-23: .75 g via INTRAVENOUS
  Filled 2015-09-22: qty 1.5

## 2015-09-22 MED ORDER — CHLORHEXIDINE GLUCONATE 0.12 % MT SOLN: 15.0000 mL | Freq: Once | OROMUCOSAL | Status: DC

## 2015-09-22 MED ORDER — SODIUM CHLORIDE 0.9 % IV SOLN
1500.0000 mg | INTRAVENOUS | Status: AC
  Administered 2015-09-23: 1500 mg via INTRAVENOUS
  Filled 2015-09-22 (×2): qty 1500

## 2015-09-22 MED ORDER — CHLORHEXIDINE GLUCONATE 4 % EX LIQD: 30.0000 mL | CUTANEOUS | Status: DC

## 2015-09-22 MED ORDER — PLASMA-LYTE 148 IV SOLN
INTRAVENOUS | Status: AC
  Administered 2015-09-23: 500 mL
  Filled 2015-09-22: qty 2.5

## 2015-09-22 MED ORDER — METOPROLOL TARTRATE 12.5 MG HALF TABLET: 12.5000 mg | ORAL_TABLET | Freq: Once | ORAL | Status: DC

## 2015-09-22 NOTE — H&P (Signed)
[address]       [name] [address]             [phone]      Cardiothoracic Surgery History and Physical   PCP is Danella Penton., MD Referring Provider is Antonieta Iba, MD  Chief Complaint  Patient presents with  . Coronary Artery Disease        HPI:  The patient is a 74 year old gentleman with a history of hypertension, hypercholesterolemia, hypothyroidism, sick sinus syndrome s/p PPM and coronary artery disease s/p MI in 2002 followed by stenting of his RCA. He had moderate disease in the LAD and LCX at that time. He had a cath in 2008 and the stent was reportedly patent. He now presents with fatigue and shortness of breath with exertion over the past several months. He has had no chest pain or pressure. He does report some numbness and pain in his arms but it usually occurs at night while sleeping and he has been told that he has bilateral carpal tunnel. He had a stress test showing:   There was no ST segment deviation noted during stress.  Defect 1: There is a defect present in the apex location.  There is a fixed apical defect with no reversibility. This could be due to an artifact given intense GI uptake affecting the quality of study. However, prior apical infarct can not be excluded.  The left ventricular ejection fraction is moderately decreased (30-44%). Calculated EF was 26% but there was poor tracking  This is an intermediate risk study.  Correlate clinically and recommend an echo to better evaluate LVSF.   An echo showed an EF of 40-45% with probable severe hypokinesis of the mid-apicalanterior, anterolateral, inferoseptal, and apical Myocardium. There was no AS or AI and no MR.  Cardiac cath on 09/12/2015 showed severe 3-vessel coronary disease:   Mid RCA lesion, 85% stenosed. The lesion was previously treated with a stent (unknown type).  Mid Cx lesion, 80% stenosed.  LM lesion, 80% stenosed.  1st Diag  lesion, 90% stenosed.  Prox LAD lesion, 60% stenosed   Past Medical History  Diagnosis Date  . Hypertension   . Pacemaker   . Hypercholesteremia   . Hypothyroidism   . MI (myocardial infarction) (HCC)   . Coronary artery disease 2002    RCA stent, 2002. 50-60% stenosis of proximal LAD, 75% stenosis OM2, patent stent mid RCA by cardiac catheterization, 04/17/07    Past Surgical History  Procedure Laterality Date  . Hammer toe surgery    . Total knee arthroplasty      right  . Shoulder surgery    . Cardiac catheterization    . Coronary angioplasty with stent placement    . Lobectomy for benign lung nodule      upper right   . Insert / replace / remove pacemaker  02/25/2015    Serial # [id number] H Model # [id number]/ [id number]  . Cardiac catheterization Left 09/12/2015    Procedure: Left Heart Cath and Coronary Angiography; Surgeon: Antonieta Iba, MD; Location: [facility] INVASIVE CV LAB; Service: Cardiovascular; Laterality: Left;    Family History  Problem Relation Age of Onset  . Heart attack Mother 69    Social History Social History  Substance Use Topics  . Smoking status: Former Smoker -- 1.00 packs/day for 25 years    Types: Cigarettes    Quit date: 10/29/1964  . Smokeless tobacco: None  . Alcohol  Use: Yes    Current Outpatient Prescriptions  Medication Sig Dispense Refill  . aspirin 81 MG chewable tablet Take 81 mg by mouth daily.    Marland Kitchen latanoprost (XALATAN) 0.005 % ophthalmic solution     . levothyroxine (SYNTHROID, LEVOTHROID) 25 MCG tablet Take 25 mcg by mouth daily before breakfast.     . meloxicam (MOBIC) 15 MG tablet Take 15 mg by mouth daily.     . metoprolol succinate (TOPROL-XL) 25 MG 24 hr tablet Take 25 mg by mouth daily.     . simvastatin (ZOCOR) 40 MG tablet Take 40 mg by mouth daily at 6 PM.      No current  facility-administered medications for this visit.    Allergies  Allergen Reactions  . Amoxicillin Nausea Only  . Amoxicillin-Pot Clavulanate Other (See Comments)    Patient reports he took oral Augmentin after a foot surgery, and it caused severe nausea; however reports that he can take 'plain' amoxicillin; he takes amoxicillin prior to any dentist procedures, and has on hand    Review of Systems  Constitutional: Positive for fatigue. Negative for activity change and appetite change.  HENT: Negative.  Eyes: Negative.  Respiratory: Positive for apnea, cough and shortness of breath.  Cardiovascular: Negative for chest pain, palpitations and leg swelling.  Gastrointestinal: Negative.  Endocrine: Negative.  Genitourinary: Negative.  Musculoskeletal: Positive for arthralgias.  Skin: Negative.  Allergic/Immunologic: Negative.  Neurological: Positive for numbness.  Hematological: Negative.  Psychiatric/Behavioral: Negative.    BP 140/91 mmHg  Pulse 80  Resp 18  Ht 6\' 1"  (1.854 m)  Wt 237 lb (107.502 kg)  BMI 31.27 kg/m2  SpO2 98% Physical Exam  Constitutional: He is oriented to person, place, and time. He appears well-developed and well-nourished. No distress.  HENT:  Head: Normocephalic and atraumatic.  Mouth/Throat: Oropharynx is clear and moist.  Eyes: EOM are normal. Pupils are equal, round, and reactive to light.  Neck: Normal range of motion. Neck supple. No JVD present. No thyromegaly present.  Cardiovascular: Normal rate, regular rhythm, normal heart sounds and intact distal pulses.  No murmur heard. Pulmonary/Chest: Effort normal and breath sounds normal. No respiratory distress. He has no rales.  Right thoracotomy scar  Abdominal: Soft. Bowel sounds are normal. He exhibits no distension and no mass. There is no tenderness.  Musculoskeletal: Normal range of motion. He exhibits no edema.  Lymphadenopathy:   He has no cervical adenopathy.    Neurological: He is alert and oriented to person, place, and time. He has normal strength. No cranial nerve deficit.  Skin: Skin is warm and dry.  Psychiatric: He has a normal mood and affect.     Diagnostic Tests:     [facility]         [address]            [place], [place] [address]              [phone]  ------------------------------------------------------------------- Transthoracic Echocardiography  Patient:  Imre, Vecchione MR #:    [id number] Study Date: 06/20/2015 Gender:   M Age:    34 Height:   185.4 cm Weight:   115.2 kg BSA:    2.47 m^2 Pt. Status: Room:  ATTENDING  Default, Provider [phone] Ernst Bowler, Tim REFERRING  [place], Tim PERFORMING  [place], [place] SONOGRAPHER Quentin Ore, RVT, RDCS, RDMS  cc:  ------------------------------------------------------------------- LV EF: 40% -  45%  ------------------------------------------------------------------- History:  PMH: Obstructive sleep apnea.  Coronary artery disease. Risk factors: Former tobacco use. Dyslipidemia.  ------------------------------------------------------------------- Study Conclusions  - Left ventricle: The cavity size was normal. There was mild concentric hypertrophy. Systolic function was mildly to moderately reduced. The estimated ejection fraction was in the range of 40% to 45%. Probable severe hypokinesis of the mid-apicalanterior, anterolateral, inferoseptal, and apical myocardium. Doppler parameters are consistent with abnormal left ventricular relaxation (grade 1 diastolic dysfunction). - Left atrium: The atrium was mildly dilated. - Right atrium: The atrium was mildly dilated.  ------------------------------------------------------------------- Labs, prior tests, procedures, and surgery: Permanent pacemaker system implantation.  ECG.    Abnormal. Transthoracic echocardiography. M-mode, complete 2D, spectral Doppler, and color Doppler. Birthdate: Patient birthdate: May 02, 1941. Age: Patient is 74 yr old. Sex: Gender: male. BMI: 33.5 kg/m^2. Blood pressure:   112/68 Patient status: Outpatient. Study date: Study date: 06/20/2015. Study time: 11:39 AM.  -------------------------------------------------------------------  ------------------------------------------------------------------- Left ventricle: The cavity size was normal. There was mild concentric hypertrophy. Systolic function was mildly to moderately reduced. The estimated ejection fraction was in the range of 40% to 45%. Regional wall motion abnormalities:  Probable severe hypokinesis of the mid-apicalanterior, anterolateral, inferoseptal, and apical myocardium. Doppler parameters are consistent with abnormal left ventricular relaxation (grade 1 diastolic dysfunction).  ------------------------------------------------------------------- Aortic valve:  Trileaflet; normal thickness leaflets. Mobility was not restricted. Doppler: Transvalvular velocity was within the normal range. There was no stenosis. There was no regurgitation.  ------------------------------------------------------------------- Aorta: Aortic root: The aortic root was normal in size.  ------------------------------------------------------------------- Mitral valve:  Structurally normal valve.  Mobility was not restricted. Doppler: Transvalvular velocity was within the normal range. There was no evidence for stenosis. There was no regurgitation.  ------------------------------------------------------------------- Left atrium: The atrium was mildly dilated.  ------------------------------------------------------------------- Right ventricle: The cavity size was normal. Wall thickness was normal. Systolic function was  normal.  ------------------------------------------------------------------- Pulmonic valve:  Doppler: Transvalvular velocity was within the normal range. There was no evidence for stenosis.  ------------------------------------------------------------------- Tricuspid valve:  Structurally normal valve.  Doppler: Transvalvular velocity was within the normal range. There was no regurgitation.  ------------------------------------------------------------------- Pulmonary artery:  The main pulmonary artery was normal-sized. Systolic pressure could not be accurately estimated.  ------------------------------------------------------------------- Right atrium: The atrium was mildly dilated.  ------------------------------------------------------------------- Pericardium: There was no pericardial effusion.  ------------------------------------------------------------------- Systemic veins: Inferior vena cava: The vessel was normal in size.  ------------------------------------------------------------------- Measurements  Left ventricle             Value    Reference LV ID, ED, PLAX chordal        43.7 mm   43 - 52 LV PW thickness, ED          11.7 mm   --------- IVS/LV PW ratio, ED          0.97     <=1.3 Stroke volume, 2D           89  ml   --------- Stroke volume/bsa, 2D         36  ml/m^2 --------- LV e&', lateral             4.58 cm/s  --------- LV E/e&', lateral            13.25    --------- LV e&', medial             6.34 cm/s  --------- LV E/e&', medial            9.57     --------- LV e&', average  5.46 cm/s  --------- LV E/e&', average            11.12    ---------  Ventricular septum           Value    Reference IVS thickness, ED           11.4  mm   ---------  LVOT                  Value    Reference LVOT ID, S               24  mm   --------- LVOT area               4.52 cm^2  --------- LVOT ID                24  mm   --------- LVOT peak velocity, S         95.5 cm/s  --------- LVOT mean velocity, S         64.3 cm/s  --------- LVOT VTI, S              19.6 cm   --------- LVOT peak gradient, S         4   mm Hg --------- Stroke volume (SV), LVOT DP      88.7 ml   --------- Stroke index (SV/bsa), LVOT DP     35.9 ml/m^2 ---------  Aorta                 Value    Reference Aortic root ID, ED           38  mm   --------- Ascending aorta ID, A-P, S       35  mm   ---------  Left atrium              Value    Reference LA ID, A-P, ES             43  mm   --------- LA ID/bsa, A-P             1.74 cm/m^2 <=2.2 LA volume, S              93  ml   --------- LA volume/bsa, S            37.6 ml/m^2 --------- LA volume, ES, 1-p A4C         90  ml   --------- LA volume/bsa, ES, 1-p A4C       36.4 ml/m^2 --------- LA volume, ES, 1-p A2C         90  ml   --------- LA volume/bsa, ES, 1-p A2C       36.4 ml/m^2 ---------  Mitral valve              Value    Reference Mitral E-wave peak velocity      60.7 cm/s  --------- Mitral A-wave peak velocity      88.4 cm/s  --------- Mitral deceleration time    (H)   292  ms   150 - 230 Mitral E/A ratio, peak         0.7     ---------  Legend: (L) and (H) mark values outside specified reference range.  ------------------------------------------------------------------- Prepared and Electronically Authenticated  by  Lorine Bears, MD 2016-09-25T08:43:26 Impression:  Physicians    Panel Physicians Referring Physician Case Authorizing Physician   Antonieta Iba, MD (Primary)  Procedures    Left Heart Cath and Coronary Angiography    Conclusion     Mid RCA lesion, 85% stenosed. The lesion was previously treated with a stent (unknown type).  Mid Cx lesion, 80% stenosed.  LM lesion, 80% stenosed.  1st Diag lesion, 90% stenosed.  Prox LAD lesion, 60% stenosed.     Technique and Indications    Cardiac Catheterization Procedure Note  Name: Jose [name] MRN: [id number] DOB: 18-Mar-1941  Procedure: Left Heart Cath, Selective Coronary Angiography, LV angiography  Indication:  74 year old gentleman with a history of coronary artery disease, stent to his RCA in 2002 at the time of an MI, notes detailing 50-60% proximal LAD disease, 75% OM 2 disease, last catheterization July 2008 at which time the RCA was reportedly patent, hyperlipidemia, prior smoking history from age 74 up to [age], sick sinus syndrome, bradycardia with heart rate in the 30s, general malaise and shortness of breath, [facility] for [employer] pacemaker, dual-chamber,  History of lobectomy for lesion in his lung, benign growth  Seen in the clinic for worsening shortness of breath, mild chest discomfort, over the past several months, even prior to placement of his pacemaker over the summer. sweating more with exertion, increasing shortness of breath with activities. Still able to do some activities but more symptomatic.   Stress test showed apical perfusion defect, depressed ejection fraction Echocardiogram showed decreased ejection fraction from prior down to 40-45% with regions of wall motion abnormality  Other past medical history he was [facility] [place] the summer 2016 when he developed malaise, shortness of breath. He went to the emergency room and was found to  have heart rate in the 30s. [facility] complication. No ischemia workup at that time  He presents today for catheterization to rule out ischemia given his worsening symptoms as above   Procedural details: The right groin was prepped, draped, and anesthetized with 1% lidocaine. Using modified Seldinger technique, a 5 French sheath was introduced into the right femoral artery. Standard Judkins catheters were used for coronary angiography and left ventriculography. Catheter exchanges were performed over a guidewire. There were no immediate procedural complications. The patient was transferred to the post catheterization recovery area for further monitoring.  Procedural Findings:  Coronary angiography:  Coronary dominance: Right  Left mainstem: Large vessel bifurcates into the LAD and left circumflex, no significant disease noted  Left anterior descending (LAD): Large vessel that extends past the apical region. There is severe long region of calcified rock small LAD disease estimated at 70-80%, also with 60% LAD after the takeoff of the diagonal. The diagonal itself is moderate size vessel with 90% proximal disease, focal lesion.  Left circumflex (LCx): Moderate to large size vessel with 80-90% mid left circumflex disease, several OM vessels, mild luminal irregularities  Right coronary artery (RCA): Moderate to large size vessel with mid RCA stent with in-stent restenosis, focal lesion 80%  Left ventriculography: Left ventricular systolic function is normal, LVEF is estimated at 50%, there is no significant mitral regurgitation or aortic valve stenosis. There appears to be apical, inferior apical and anteroapical hypokinesis  Final Conclusions:  Severe three-vessel disease. Discussed with Dr. Kirke CorinArida,  It is felt best option may be bypass surgery. He is a good candidate, mild COPD. May benefit from LIMA to the LAD, vein graft to diagonal, OM, RCA. Results were  discussed with the patient  Recommendations:  Consultation request has been placed to meet with  CT surgery as an outpatient. We have recommended he not wait on his surgery, try to have this ASAP. He was planning on going to [place] in late January but understands the need to address this first  Julien Nordmann 09/12/2015, 1:35 PM   Estimated blood loss <50 mL. There were no immediate complications during the procedure.    Coronary Findings    Dominance: Co-dominant   Left Main   . LM lesion, 80% stenosed. Severely Calcified.     Left Anterior Descending   . Prox LAD lesion, 60% stenosed.   . First Diagonal Branch   . 1st Diag lesion, 90% stenosed. Discrete.     Left Circumflex   . Mid Cx lesion, 80% stenosed.     Right Coronary Artery   . Mid RCA lesion, 85% stenosed. Discrete. The lesion was previously treated with a stent (unknown type).      Wall Motion                 Coronary Diagrams    Diagnostic Diagram            Implants    Name ID Temporary Type Supply   DEVICE CLOSURE MYNXGRIP 34F - [id number] No Vascular Products DEVICE CLOSURE MYNXGRIP 34F    PACS Images    Show images for Cardiac catheterization     Link to Procedure Log    Procedure Log      Hemo Data    AO Systolic Cath Pressure AO Diastolic Cath Pressure AO Mean Cath Pressure LV Systolic Cath Pressure LV End Diastolic   -- -- -- 139 mmHg 16 mmHg   -- -- -- 132 mmHg 16 mmHg   -- -- -- 141 mmHg 20 mmHg   143 67 mmHg 94 mmHg -- --     Impression:  He has severe 3- vessel coronary artery disease with moderate LV dysfunction and a markedly abnormal stress test presenting with progressive exertional fatigue and shortness of breath. I agree that CABG is the best treatment for him for relief of symptoms and survival benefit. I discussed the operative procedure  with the patient and his wife including alternatives, benefits and risks; including but not limited to bleeding, blood transfusion, infection, stroke, myocardial infarction, graft failure, heart block requiring a permanent pacemaker, organ dysfunction, and death. Jose [name] understands and agrees to proceed.    Plan:  CABG on Tuesday 09/23/2015  Alleen Borne, MD [facility] [phone]

## 2015-09-23 ENCOUNTER — Encounter (HOSPITAL_COMMUNITY): Payer: Self-pay | Admitting: Certified Registered Nurse Anesthetist

## 2015-09-23 ENCOUNTER — Inpatient Hospital Stay (HOSPITAL_COMMUNITY): Payer: Medicare Other

## 2015-09-23 ENCOUNTER — Inpatient Hospital Stay (HOSPITAL_COMMUNITY): Payer: Medicare Other | Admitting: Vascular Surgery

## 2015-09-23 ENCOUNTER — Inpatient Hospital Stay (HOSPITAL_COMMUNITY): Payer: Medicare Other | Admitting: Certified Registered Nurse Anesthetist

## 2015-09-23 ENCOUNTER — Encounter (HOSPITAL_COMMUNITY)
Admission: RE | Disposition: A | Discharge status: Home / Self Care | Payer: Medicare Other | Source: Ambulatory Visit | Attending: Surgery

## 2015-09-23 ENCOUNTER — Inpatient Hospital Stay (HOSPITAL_COMMUNITY)
Admission: RE | Admit: 2015-09-23 | Discharge: 2015-09-28 | DRG: 236 | Disposition: A | Discharge status: Home / Self Care | Payer: Medicare Other | Source: Ambulatory Visit | Attending: Surgery | Admitting: Surgery

## 2015-09-23 DIAGNOSIS — I2510 Atherosclerotic heart disease of native coronary artery without angina pectoris: Secondary | ICD-10-CM

## 2015-09-23 DIAGNOSIS — I10 Essential (primary) hypertension: Secondary | ICD-10-CM | POA: Diagnosis present

## 2015-09-23 DIAGNOSIS — Z6833 Body mass index (BMI) 33.0-33.9, adult: Secondary | ICD-10-CM | POA: Diagnosis not present

## 2015-09-23 DIAGNOSIS — K5900 Constipation, unspecified: Secondary | ICD-10-CM | POA: Diagnosis not present

## 2015-09-23 DIAGNOSIS — E119 Type 2 diabetes mellitus without complications: Secondary | ICD-10-CM | POA: Diagnosis present

## 2015-09-23 DIAGNOSIS — I4891 Unspecified atrial fibrillation: Secondary | ICD-10-CM | POA: Diagnosis present

## 2015-09-23 DIAGNOSIS — M1611 Unilateral primary osteoarthritis, right hip: Secondary | ICD-10-CM | POA: Diagnosis present

## 2015-09-23 DIAGNOSIS — Z950 Presence of cardiac pacemaker: Secondary | ICD-10-CM

## 2015-09-23 DIAGNOSIS — E7800 Pure hypercholesterolemia, unspecified: Secondary | ICD-10-CM | POA: Diagnosis present

## 2015-09-23 DIAGNOSIS — T82855A Stenosis of coronary artery stent, initial encounter: Secondary | ICD-10-CM | POA: Diagnosis present

## 2015-09-23 DIAGNOSIS — Z87891 Personal history of nicotine dependence: Secondary | ICD-10-CM

## 2015-09-23 DIAGNOSIS — D696 Thrombocytopenia, unspecified: Secondary | ICD-10-CM | POA: Diagnosis not present

## 2015-09-23 DIAGNOSIS — D62 Acute posthemorrhagic anemia: Secondary | ICD-10-CM | POA: Diagnosis not present

## 2015-09-23 DIAGNOSIS — Z951 Presence of aortocoronary bypass graft: Secondary | ICD-10-CM

## 2015-09-23 DIAGNOSIS — G4733 Obstructive sleep apnea (adult) (pediatric): Secondary | ICD-10-CM | POA: Diagnosis present

## 2015-09-23 DIAGNOSIS — I252 Old myocardial infarction: Secondary | ICD-10-CM

## 2015-09-23 DIAGNOSIS — E039 Hypothyroidism, unspecified: Secondary | ICD-10-CM | POA: Diagnosis present

## 2015-09-23 DIAGNOSIS — E785 Hyperlipidemia, unspecified: Secondary | ICD-10-CM | POA: Diagnosis present

## 2015-09-23 DIAGNOSIS — J449 Chronic obstructive pulmonary disease, unspecified: Secondary | ICD-10-CM | POA: Diagnosis not present

## 2015-09-23 DIAGNOSIS — I25119 Atherosclerotic heart disease of native coronary artery with unspecified angina pectoris: Principal | ICD-10-CM | POA: Diagnosis present

## 2015-09-23 DIAGNOSIS — G5603 Carpal tunnel syndrome, bilateral upper limbs: Secondary | ICD-10-CM | POA: Diagnosis present

## 2015-09-23 DIAGNOSIS — Z96651 Presence of right artificial knee joint: Secondary | ICD-10-CM | POA: Diagnosis present

## 2015-09-23 DIAGNOSIS — M79604 Pain in right leg: Secondary | ICD-10-CM

## 2015-09-23 HISTORY — PX: CORONARY ARTERY BYPASS GRAFT: SHX141

## 2015-09-23 HISTORY — PX: TEE WITHOUT CARDIOVERSION: SHX5443

## 2015-09-23 LAB — POCT I-STAT, CHEM 8
BUN: 15 mg/dL (ref 6–20)
BUN: 14 mg/dL (ref 6–20)
BUN: 13 mg/dL (ref 6–20)
BUN: 12 mg/dL (ref 6–20)
BUN: 12 mg/dL (ref 6–20)
BUN: 10 mg/dL (ref 6–20)
CALCIUM ION: 1.17 mmol/L (ref 1.13–1.30)
CALCIUM ION: 1.23 mmol/L (ref 1.13–1.30)
CALCIUM ION: 1.06 mmol/L — AB (ref 1.13–1.30)
CALCIUM ION: 1.1 mmol/L — AB (ref 1.13–1.30)
CALCIUM ION: 1.09 mmol/L — AB (ref 1.13–1.30)
CALCIUM ION: 1.13 mmol/L (ref 1.13–1.30)
CHLORIDE: 101 mmol/L (ref 101–111)
CHLORIDE: 96 mmol/L — AB (ref 101–111)
CHLORIDE: 104 mmol/L (ref 101–111)
CREATININE: 0.6 mg/dL — AB (ref 0.61–1.24)
CREATININE: 0.6 mg/dL — AB (ref 0.61–1.24)
CREATININE: 0.6 mg/dL — AB (ref 0.61–1.24)
CREATININE: 0.6 mg/dL — AB (ref 0.61–1.24)
CREATININE: 0.7 mg/dL (ref 0.61–1.24)
Chloride: 100 mmol/L — ABNORMAL LOW (ref 101–111)
Chloride: 98 mmol/L — ABNORMAL LOW (ref 101–111)
Chloride: 99 mmol/L — ABNORMAL LOW (ref 101–111)
Creatinine, Ser: 0.6 mg/dL — ABNORMAL LOW (ref 0.61–1.24)
GLUCOSE: 108 mg/dL — AB (ref 65–99)
GLUCOSE: 127 mg/dL — AB (ref 65–99)
GLUCOSE: 114 mg/dL — AB (ref 65–99)
GLUCOSE: 159 mg/dL — AB (ref 65–99)
GLUCOSE: 121 mg/dL — AB (ref 65–99)
Glucose, Bld: 138 mg/dL — ABNORMAL HIGH (ref 65–99)
HCT: 43 % (ref 39.0–52.0)
HCT: 40 % (ref 39.0–52.0)
HCT: 33 % — ABNORMAL LOW (ref 39.0–52.0)
HCT: 35 % — ABNORMAL LOW (ref 39.0–52.0)
HCT: 34 % — ABNORMAL LOW (ref 39.0–52.0)
HCT: 40 % (ref 39.0–52.0)
HEMOGLOBIN: 13.6 g/dL (ref 13.0–17.0)
HEMOGLOBIN: 11.6 g/dL — AB (ref 13.0–17.0)
Hemoglobin: 14.6 g/dL (ref 13.0–17.0)
Hemoglobin: 11.2 g/dL — ABNORMAL LOW (ref 13.0–17.0)
Hemoglobin: 11.9 g/dL — ABNORMAL LOW (ref 13.0–17.0)
Hemoglobin: 13.6 g/dL (ref 13.0–17.0)
POTASSIUM: 4.3 mmol/L (ref 3.5–5.1)
Potassium: 4 mmol/L (ref 3.5–5.1)
Potassium: 4.2 mmol/L (ref 3.5–5.1)
Potassium: 4 mmol/L (ref 3.5–5.1)
Potassium: 4.6 mmol/L (ref 3.5–5.1)
Potassium: 3.9 mmol/L (ref 3.5–5.1)
Sodium: 138 mmol/L (ref 135–145)
Sodium: 137 mmol/L (ref 135–145)
Sodium: 134 mmol/L — ABNORMAL LOW (ref 135–145)
Sodium: 135 mmol/L (ref 135–145)
Sodium: 137 mmol/L (ref 135–145)
Sodium: 136 mmol/L (ref 135–145)
TCO2: 30 mmol/L (ref 0–100)
TCO2: 31 mmol/L (ref 0–100)
TCO2: 29 mmol/L (ref 0–100)
TCO2: 26 mmol/L (ref 0–100)
TCO2: 29 mmol/L (ref 0–100)
TCO2: 27 mmol/L (ref 0–100)

## 2015-09-23 LAB — POCT I-STAT 3, ART BLOOD GAS (G3+)
ACID-BASE DEFICIT: 2 mmol/L (ref 0.0–2.0)
ACID-BASE EXCESS: 4 mmol/L — AB (ref 0.0–2.0)
ACID-BASE EXCESS: 2 mmol/L (ref 0.0–2.0)
ACID-BASE EXCESS: 2 mmol/L (ref 0.0–2.0)
BICARBONATE: 27.9 meq/L — AB (ref 20.0–24.0)
BICARBONATE: 23.3 meq/L (ref 20.0–24.0)
Bicarbonate: 29.2 mEq/L — ABNORMAL HIGH (ref 20.0–24.0)
Bicarbonate: 26.7 mEq/L — ABNORMAL HIGH (ref 20.0–24.0)
O2 SAT: 100 %
O2 SAT: 99 %
O2 SAT: 99 %
O2 SAT: 92 %
PCO2 ART: 47.5 mmHg — AB (ref 35.0–45.0)
PCO2 ART: 49.9 mmHg — AB (ref 35.0–45.0)
PCO2 ART: 40.9 mmHg (ref 35.0–45.0)
PH ART: 7.384 (ref 7.350–7.450)
PO2 ART: 367 mmHg — AB (ref 80.0–100.0)
PO2 ART: 128 mmHg — AB (ref 80.0–100.0)
PO2 ART: 71 mmHg — AB (ref 80.0–100.0)
Patient temperature: 36.9
Patient temperature: 100.6
TCO2: 31 mmol/L (ref 0–100)
TCO2: 29 mmol/L (ref 0–100)
TCO2: 28 mmol/L (ref 0–100)
TCO2: 24 mmol/L (ref 0–100)
pCO2 arterial: 45.4 mmHg — ABNORMAL HIGH (ref 35.0–45.0)
pH, Arterial: 7.396 (ref 7.350–7.450)
pH, Arterial: 7.356 (ref 7.350–7.450)
pH, Arterial: 7.368 (ref 7.350–7.450)
pO2, Arterial: 126 mmHg — ABNORMAL HIGH (ref 80.0–100.0)

## 2015-09-23 LAB — CBC
HEMATOCRIT: 38 % — AB (ref 39.0–52.0)
HEMATOCRIT: 37.4 % — AB (ref 39.0–52.0)
Hemoglobin: 13 g/dL (ref 13.0–17.0)
Hemoglobin: 12.8 g/dL — ABNORMAL LOW (ref 13.0–17.0)
MCH: 33.1 pg (ref 26.0–34.0)
MCH: 33 pg (ref 26.0–34.0)
MCHC: 34.2 g/dL (ref 30.0–36.0)
MCHC: 34.2 g/dL (ref 30.0–36.0)
MCV: 96.7 fL (ref 78.0–100.0)
MCV: 96.4 fL (ref 78.0–100.0)
PLATELETS: 117 10*3/uL — AB (ref 150–400)
Platelets: 111 10*3/uL — ABNORMAL LOW (ref 150–400)
RBC: 3.93 MIL/uL — ABNORMAL LOW (ref 4.22–5.81)
RBC: 3.88 MIL/uL — ABNORMAL LOW (ref 4.22–5.81)
RDW: 13.3 % (ref 11.5–15.5)
RDW: 13.5 % (ref 11.5–15.5)
WBC: 10.4 10*3/uL (ref 4.0–10.5)
WBC: 8.4 10*3/uL (ref 4.0–10.5)

## 2015-09-23 LAB — GLUCOSE, CAPILLARY
GLUCOSE-CAPILLARY: 113 mg/dL — AB (ref 65–99)
GLUCOSE-CAPILLARY: 123 mg/dL — AB (ref 65–99)
GLUCOSE-CAPILLARY: 105 mg/dL — AB (ref 65–99)
Glucose-Capillary: 107 mg/dL — ABNORMAL HIGH (ref 65–99)
Glucose-Capillary: 105 mg/dL — ABNORMAL HIGH (ref 65–99)
Glucose-Capillary: 106 mg/dL — ABNORMAL HIGH (ref 65–99)
Glucose-Capillary: 114 mg/dL — ABNORMAL HIGH (ref 65–99)
Glucose-Capillary: 115 mg/dL — ABNORMAL HIGH (ref 65–99)
Glucose-Capillary: 107 mg/dL — ABNORMAL HIGH (ref 65–99)

## 2015-09-23 LAB — PROTIME-INR
INR: 1.47 (ref 0.00–1.49)
PROTHROMBIN TIME: 17.9 s — AB (ref 11.6–15.2)

## 2015-09-23 LAB — CREATININE, SERUM
Creatinine, Ser: 0.78 mg/dL (ref 0.61–1.24)
GFR calc non Af Amer: >60 mL/min (ref >60)

## 2015-09-23 LAB — POCT I-STAT 4, (NA,K, GLUC, HGB,HCT)
Glucose, Bld: 124 mg/dL — ABNORMAL HIGH (ref 65–99)
HCT: 39 % (ref 39.0–52.0)
Hemoglobin: 13.3 g/dL (ref 13.0–17.0)
Potassium: 4.1 mmol/L (ref 3.5–5.1)
SODIUM: 137 mmol/L (ref 135–145)

## 2015-09-23 LAB — HEMOGLOBIN AND HEMATOCRIT, BLOOD
HCT: 33.2 % — ABNORMAL LOW (ref 39.0–52.0)
HEMOGLOBIN: 11.5 g/dL — AB (ref 13.0–17.0)

## 2015-09-23 LAB — PLATELET COUNT: Platelets: 107 10*3/uL — ABNORMAL LOW (ref 150–400)

## 2015-09-23 LAB — APTT: aPTT: 30 seconds (ref 24–37)

## 2015-09-23 LAB — MAGNESIUM: MAGNESIUM: 2.6 mg/dL — AB (ref 1.7–2.4)

## 2015-09-23 SURGERY — CORONARY ARTERY BYPASS GRAFTING (CABG)
Anesthesia: General | Site: Chest

## 2015-09-23 MED ORDER — SODIUM CHLORIDE 0.9 % IJ SOLN
3.0000 mL | Freq: Two times a day (BID) | INTRAMUSCULAR | Status: DC
  Administered 2015-09-24 – 2015-09-26 (×4): 3 mL via INTRAVENOUS

## 2015-09-23 MED ORDER — MILRINONE IN DEXTROSE 20 MG/100ML IV SOLN
0.1250 ug/kg/min | INTRAVENOUS | Status: DC
  Filled 2015-09-23: qty 100

## 2015-09-23 MED ORDER — FENTANYL CITRATE (PF) 100 MCG/2ML IJ SOLN
INTRAMUSCULAR | Status: DC | PRN
  Administered 2015-09-23: 100 ug via INTRAVENOUS
  Administered 2015-09-23: 150 ug via INTRAVENOUS
  Administered 2015-09-23: 250 ug via INTRAVENOUS
  Administered 2015-09-23 (×2): 100 ug via INTRAVENOUS
  Administered 2015-09-23: 50 ug via INTRAVENOUS
  Administered 2015-09-23 (×2): 100 ug via INTRAVENOUS
  Administered 2015-09-23: 50 ug via INTRAVENOUS
  Administered 2015-09-23: 100 ug via INTRAVENOUS
  Administered 2015-09-23: 50 ug via INTRAVENOUS
  Administered 2015-09-23: 250 ug via INTRAVENOUS
  Administered 2015-09-23: 100 ug via INTRAVENOUS

## 2015-09-23 MED ORDER — INSULIN REGULAR BOLUS VIA INFUSION
0.0000 [IU] | Freq: Three times a day (TID) | INTRAVENOUS | Status: DC
  Filled 2015-09-23: qty 10

## 2015-09-23 MED ORDER — PROPOFOL 10 MG/ML IV BOLUS
INTRAVENOUS | Status: DC | PRN
  Administered 2015-09-23: 120 mg via INTRAVENOUS
  Administered 2015-09-23: 50 mg via INTRAVENOUS

## 2015-09-23 MED ORDER — BISACODYL 5 MG PO TBEC
10.0000 mg | DELAYED_RELEASE_TABLET | Freq: Every day | ORAL | Status: DC
  Administered 2015-09-24 – 2015-09-26 (×2): 10 mg via ORAL
  Filled 2015-09-23 (×2): qty 2

## 2015-09-23 MED ORDER — LACTATED RINGERS IV SOLN
INTRAVENOUS | Status: DC | PRN
  Administered 2015-09-23: 07:00:00 via INTRAVENOUS

## 2015-09-23 MED ORDER — ASPIRIN 81 MG PO CHEW
324.0000 mg | CHEWABLE_TABLET | Freq: Every day | ORAL | Status: DC
  Filled 2015-09-23: qty 4

## 2015-09-23 MED ORDER — CHLORHEXIDINE GLUCONATE 0.12% ORAL RINSE (MEDLINE KIT)
15.0000 mL | Freq: Two times a day (BID) | OROMUCOSAL | Status: DC
  Administered 2015-09-23 – 2015-09-25 (×4): 15 mL via OROMUCOSAL

## 2015-09-23 MED ORDER — HEPARIN SODIUM (PORCINE) 1000 UNIT/ML IJ SOLN
INTRAMUSCULAR | Status: DC | PRN
  Administered 2015-09-23: 36000 [IU] via INTRAVENOUS

## 2015-09-23 MED ORDER — FENTANYL CITRATE (PF) 250 MCG/5ML IJ SOLN
INTRAMUSCULAR | Status: AC
  Filled 2015-09-23: qty 5

## 2015-09-23 MED ORDER — CHLORHEXIDINE GLUCONATE 0.12 % MT SOLN
15.0000 mL | OROMUCOSAL | Status: AC
  Administered 2015-09-23: 15 mL via OROMUCOSAL

## 2015-09-23 MED ORDER — ROCURONIUM BROMIDE 100 MG/10ML IV SOLN
INTRAVENOUS | Status: DC | PRN
  Administered 2015-09-23 (×3): 50 mg via INTRAVENOUS
  Administered 2015-09-23: 30 mg via INTRAVENOUS
  Administered 2015-09-23: 20 mg via INTRAVENOUS
  Administered 2015-09-23: 30 mg via INTRAVENOUS

## 2015-09-23 MED ORDER — ACETAMINOPHEN 650 MG RE SUPP
650.0000 mg | Freq: Once | RECTAL | Status: AC
  Administered 2015-09-23: 650 mg via RECTAL

## 2015-09-23 MED ORDER — PROTAMINE SULFATE 10 MG/ML IV SOLN
INTRAVENOUS | Status: DC | PRN
  Administered 2015-09-23: 30 mg via INTRAVENOUS

## 2015-09-23 MED ORDER — POTASSIUM CHLORIDE 10 MEQ/50ML IV SOLN
10.0000 meq | INTRAVENOUS | Status: AC
Start: 2015-09-23 — End: 2015-09-23

## 2015-09-23 MED ORDER — ACETAMINOPHEN 500 MG PO TABS
1000.0000 mg | ORAL_TABLET | Freq: Four times a day (QID) | ORAL | Status: DC
  Administered 2015-09-24 – 2015-09-26 (×9): 1000 mg via ORAL
  Filled 2015-09-23 (×9): qty 2

## 2015-09-23 MED ORDER — METOPROLOL TARTRATE 1 MG/ML IV SOLN: 2.5000 mg | INTRAVENOUS | Status: DC | PRN

## 2015-09-23 MED ORDER — GELATIN ABSORBABLE MT POWD
OROMUCOSAL | Status: DC | PRN
  Administered 2015-09-23 (×3): 4 mL via TOPICAL

## 2015-09-23 MED ORDER — BISACODYL 10 MG RE SUPP: 10.0000 mg | Freq: Every day | RECTAL | Status: DC

## 2015-09-23 MED ORDER — HEMOSTATIC AGENTS (NO CHARGE) OPTIME
TOPICAL | Status: DC | PRN
  Administered 2015-09-23: 1 via TOPICAL

## 2015-09-23 MED ORDER — FAMOTIDINE IN NACL 20-0.9 MG/50ML-% IV SOLN
20.0000 mg | Freq: Two times a day (BID) | INTRAVENOUS | Status: DC
  Administered 2015-09-23: 20 mg via INTRAVENOUS

## 2015-09-23 MED ORDER — METOPROLOL TARTRATE 12.5 MG HALF TABLET
12.5000 mg | ORAL_TABLET | Freq: Two times a day (BID) | ORAL | Status: DC
  Administered 2015-09-24 – 2015-09-26 (×5): 12.5 mg via ORAL
  Filled 2015-09-23 (×5): qty 1

## 2015-09-23 MED ORDER — LACTATED RINGERS IV SOLN
INTRAVENOUS | Status: DC
  Administered 2015-09-23: 14:00:00 via INTRAVENOUS

## 2015-09-23 MED ORDER — MIDAZOLAM HCL 2 MG/2ML IJ SOLN
2.0000 mg | INTRAMUSCULAR | Status: DC | PRN
Start: 2015-09-23 — End: 2015-09-24

## 2015-09-23 MED ORDER — DOCUSATE SODIUM 100 MG PO CAPS
200.0000 mg | ORAL_CAPSULE | Freq: Every day | ORAL | Status: DC
  Administered 2015-09-24 – 2015-09-26 (×3): 200 mg via ORAL
  Filled 2015-09-23 (×3): qty 2

## 2015-09-23 MED ORDER — INSULIN REGULAR HUMAN 100 UNIT/ML IJ SOLN
INTRAMUSCULAR | Status: DC
Start: 2015-09-23 — End: 2015-09-25
  Administered 2015-09-23 (×2): 1.4 [IU]/h via INTRAVENOUS
  Filled 2015-09-23 (×2): qty 2.5

## 2015-09-23 MED ORDER — PROPOFOL 10 MG/ML IV BOLUS
INTRAVENOUS | Status: AC
  Filled 2015-09-23: qty 20

## 2015-09-23 MED ORDER — TRAMADOL HCL 50 MG PO TABS
50.0000 mg | ORAL_TABLET | ORAL | Status: DC | PRN
  Administered 2015-09-24 – 2015-09-25 (×2): 100 mg via ORAL
  Filled 2015-09-23 (×2): qty 2

## 2015-09-23 MED ORDER — ACETAMINOPHEN 160 MG/5ML PO SOLN: 1000.0000 mg | Freq: Four times a day (QID) | ORAL | Status: DC

## 2015-09-23 MED ORDER — LEVOFLOXACIN IN D5W 750 MG/150ML IV SOLN
750.0000 mg | INTRAVENOUS | Status: AC
  Administered 2015-09-24: 750 mg via INTRAVENOUS
  Filled 2015-09-23 (×2): qty 150

## 2015-09-23 MED ORDER — LACTATED RINGERS IV SOLN
INTRAVENOUS | Status: DC
  Administered 2015-09-23: 13:00:00 via INTRAVENOUS

## 2015-09-23 MED ORDER — MAGNESIUM SULFATE 4 GM/100ML IV SOLN
4.0000 g | Freq: Once | INTRAVENOUS | Status: AC
  Administered 2015-09-23: 4 g via INTRAVENOUS
  Filled 2015-09-23: qty 100

## 2015-09-23 MED ORDER — METOPROLOL TARTRATE 25 MG/10 ML ORAL SUSPENSION: 12.5000 mg | Freq: Two times a day (BID) | ORAL | Status: DC

## 2015-09-23 MED ORDER — DEXMEDETOMIDINE HCL IN NACL 200 MCG/50ML IV SOLN
0.0000 ug/kg/h | INTRAVENOUS | Status: DC
  Administered 2015-09-23: 0.7 ug/kg/h via INTRAVENOUS

## 2015-09-23 MED ORDER — VANCOMYCIN HCL IN DEXTROSE 1-5 GM/200ML-% IV SOLN
1000.0000 mg | Freq: Once | INTRAVENOUS | Status: AC
  Administered 2015-09-23: 1000 mg via INTRAVENOUS
  Filled 2015-09-23: qty 200

## 2015-09-23 MED ORDER — ALBUMIN HUMAN 5 % IV SOLN
250.0000 mL | INTRAVENOUS | Status: AC | PRN
  Administered 2015-09-23 (×2): 250 mL via INTRAVENOUS

## 2015-09-23 MED ORDER — MIDAZOLAM HCL 5 MG/5ML IJ SOLN
INTRAMUSCULAR | Status: DC | PRN
  Administered 2015-09-23: 3 mg via INTRAVENOUS
  Administered 2015-09-23 (×3): 2 mg via INTRAVENOUS
  Administered 2015-09-23: 1 mg via INTRAVENOUS

## 2015-09-23 MED ORDER — ASPIRIN EC 325 MG PO TBEC
325.0000 mg | DELAYED_RELEASE_TABLET | Freq: Every day | ORAL | Status: DC
  Administered 2015-09-24 – 2015-09-26 (×3): 325 mg via ORAL
  Filled 2015-09-23 (×3): qty 1

## 2015-09-23 MED ORDER — MIDAZOLAM HCL 10 MG/2ML IJ SOLN
INTRAMUSCULAR | Status: AC
  Filled 2015-09-23: qty 2

## 2015-09-23 MED ORDER — SIMVASTATIN 40 MG PO TABS
40.0000 mg | ORAL_TABLET | Freq: Every day | ORAL | Status: DC
  Administered 2015-09-24 – 2015-09-27 (×4): 40 mg via ORAL
  Filled 2015-09-23 (×4): qty 1

## 2015-09-23 MED ORDER — DEXMEDETOMIDINE HCL IN NACL 200 MCG/50ML IV SOLN
0.4000 ug/kg/h | INTRAVENOUS | Status: DC
  Filled 2015-09-23: qty 50

## 2015-09-23 MED ORDER — OXYCODONE HCL 5 MG PO TABS
5.0000 mg | ORAL_TABLET | ORAL | Status: DC | PRN
  Administered 2015-09-23 (×2): 5 mg via ORAL
  Administered 2015-09-24 – 2015-09-25 (×6): 10 mg via ORAL
  Administered 2015-09-25: 5 mg via ORAL
  Administered 2015-09-26: 10 mg via ORAL
  Filled 2015-09-23 (×2): qty 2
  Filled 2015-09-23 (×2): qty 1
  Filled 2015-09-23: qty 2
  Filled 2015-09-23: qty 1
  Filled 2015-09-23 (×4): qty 2

## 2015-09-23 MED ORDER — FENTANYL CITRATE (PF) 250 MCG/5ML IJ SOLN
INTRAMUSCULAR | Status: AC
  Filled 2015-09-23: qty 20

## 2015-09-23 MED ORDER — LEVOTHYROXINE SODIUM 25 MCG PO TABS
25.0000 ug | ORAL_TABLET | Freq: Every day | ORAL | Status: DC
  Administered 2015-09-24 – 2015-09-28 (×5): 25 ug via ORAL
  Filled 2015-09-23 (×5): qty 1

## 2015-09-23 MED ORDER — ONDANSETRON HCL 4 MG/2ML IJ SOLN: 4.0000 mg | Freq: Four times a day (QID) | INTRAMUSCULAR | Status: DC | PRN

## 2015-09-23 MED ORDER — PANTOPRAZOLE SODIUM 40 MG PO TBEC
40.0000 mg | DELAYED_RELEASE_TABLET | Freq: Every day | ORAL | Status: DC
  Administered 2015-09-25 – 2015-09-26 (×2): 40 mg via ORAL
  Filled 2015-09-23 (×2): qty 1

## 2015-09-23 MED ORDER — NITROGLYCERIN IN D5W 200-5 MCG/ML-% IV SOLN: 0.0000 ug/min | INTRAVENOUS | Status: DC

## 2015-09-23 MED ORDER — SODIUM CHLORIDE 0.9 % IJ SOLN
3.0000 mL | INTRAMUSCULAR | Status: DC | PRN
  Administered 2015-09-25: 3 mL via INTRAVENOUS
  Filled 2015-09-23: qty 3

## 2015-09-23 MED ORDER — THROMBIN 20000 UNITS EX SOLR
CUTANEOUS | Status: AC
  Filled 2015-09-23: qty 20000

## 2015-09-23 MED ORDER — PHENYLEPHRINE HCL 10 MG/ML IJ SOLN
0.0000 ug/min | INTRAVENOUS | Status: DC
  Filled 2015-09-23 (×2): qty 2

## 2015-09-23 MED ORDER — MORPHINE SULFATE (PF) 2 MG/ML IV SOLN
2.0000 mg | INTRAVENOUS | Status: DC | PRN
Start: 2015-09-23 — End: 2015-09-26
  Administered 2015-09-23: 4 mg via INTRAVENOUS
  Administered 2015-09-23: 2 mg via INTRAVENOUS
  Administered 2015-09-23: 4 mg via INTRAVENOUS
  Administered 2015-09-24: 2 mg via INTRAVENOUS
  Administered 2015-09-24 (×6): 4 mg via INTRAVENOUS
  Filled 2015-09-23 (×2): qty 2
  Filled 2015-09-23: qty 1
  Filled 2015-09-23 (×6): qty 2
  Filled 2015-09-23: qty 1

## 2015-09-23 MED ORDER — THROMBIN 20000 UNITS EX SOLR
CUTANEOUS | Status: DC | PRN
  Administered 2015-09-23: 20000 [IU] via TOPICAL

## 2015-09-23 MED ORDER — SODIUM CHLORIDE 0.9 % IV SOLN
INTRAVENOUS | Status: DC
  Administered 2015-09-23: 13:00:00 via INTRAVENOUS

## 2015-09-23 MED ORDER — LACTATED RINGERS IV SOLN
INTRAVENOUS | Status: DC | PRN
  Administered 2015-09-23 (×2): via INTRAVENOUS

## 2015-09-23 MED ORDER — MORPHINE SULFATE (PF) 2 MG/ML IV SOLN: 1.0000 mg | INTRAVENOUS | Status: DC | PRN

## 2015-09-23 MED ORDER — LATANOPROST 0.005 % OP SOLN
1.0000 [drp] | Freq: Every day | OPHTHALMIC | Status: DC
  Administered 2015-09-24 – 2015-09-27 (×3): 1 [drp] via OPHTHALMIC
  Filled 2015-09-23 (×5): qty 2.5

## 2015-09-23 MED ORDER — ANTISEPTIC ORAL RINSE SOLUTION (CORINZ)
7.0000 mL | Freq: Four times a day (QID) | OROMUCOSAL | Status: DC
  Administered 2015-09-23 – 2015-09-26 (×10): 7 mL via OROMUCOSAL

## 2015-09-23 MED ORDER — SODIUM CHLORIDE 0.9 % IV SOLN
0.5000 g/h | Freq: Once | INTRAVENOUS | Status: DC
  Filled 2015-09-23: qty 20

## 2015-09-23 MED ORDER — SODIUM CHLORIDE 0.45 % IV SOLN
INTRAVENOUS | Status: DC | PRN
  Administered 2015-09-23: 13:00:00 via INTRAVENOUS

## 2015-09-23 MED ORDER — 0.9 % SODIUM CHLORIDE (POUR BTL) OPTIME
TOPICAL | Status: DC | PRN
  Administered 2015-09-23: 5000 mL

## 2015-09-23 MED ORDER — LACTATED RINGERS IV SOLN: 500.0000 mL | Freq: Once | INTRAVENOUS | Status: DC | PRN

## 2015-09-23 MED ORDER — MOVING RIGHT ALONG BOOK
Freq: Once | Status: AC
  Administered 2015-09-26: 12:00:00
  Filled 2015-09-23: qty 1

## 2015-09-23 MED ORDER — SODIUM CHLORIDE 0.9 % IV SOLN: 250.0000 mL | INTRAVENOUS | Status: DC

## 2015-09-23 MED ORDER — ACETAMINOPHEN 160 MG/5ML PO SOLN: 650.0000 mg | Freq: Once | ORAL | Status: AC

## 2015-09-23 MED FILL — Heparin Sodium (Porcine) Inj 1000 Unit/ML: INTRAMUSCULAR | Qty: 30 | Status: AC

## 2015-09-23 MED FILL — Magnesium Sulfate Inj 50%: INTRAMUSCULAR | Qty: 10 | Status: AC

## 2015-09-23 MED FILL — Potassium Chloride Inj 2 mEq/ML: INTRAVENOUS | Qty: 40 | Status: AC

## 2015-09-23 SURGICAL SUPPLY — 109 items
BAG DECANTER FOR FLEXI CONT (MISCELLANEOUS) ×3 IMPLANT
BANDAGE ELASTIC 4 VELCRO ST LF (GAUZE/BANDAGES/DRESSINGS) ×3 IMPLANT
BANDAGE ELASTIC 6 VELCRO ST LF (GAUZE/BANDAGES/DRESSINGS) ×3 IMPLANT
BASKET HEART (ORDER IN 25'S) (MISCELLANEOUS) ×1
BASKET HEART (ORDER IN 25S) (MISCELLANEOUS) ×2 IMPLANT
BENZOIN TINCTURE PRP APPL 2/3 (GAUZE/BANDAGES/DRESSINGS) ×3 IMPLANT
BLADE STERNUM SYSTEM 6 (BLADE) ×3 IMPLANT
BNDG GAUZE ELAST 4 BULKY (GAUZE/BANDAGES/DRESSINGS) ×3 IMPLANT
CABLE PACING FASLOC BIEGE (MISCELLANEOUS) ×3 IMPLANT
CANISTER SUCTION 2500CC (MISCELLANEOUS) ×3 IMPLANT
CATH ROBINSON RED A/P 18FR (CATHETERS) ×6 IMPLANT
CATH THORACIC 28FR (CATHETERS) ×3 IMPLANT
CATH THORACIC 36FR (CATHETERS) ×3 IMPLANT
CATH THORACIC 36FR RT ANG (CATHETERS) ×3 IMPLANT
CLIP TI MEDIUM 24 (CLIP) IMPLANT
CLIP TI WIDE RED SMALL 24 (CLIP) ×3 IMPLANT
COVER SURGICAL LIGHT HANDLE (MISCELLANEOUS) ×3 IMPLANT
CRADLE DONUT ADULT HEAD (MISCELLANEOUS) ×3 IMPLANT
DRAPE CARDIOVASCULAR INCISE (DRAPES) ×1
DRAPE SLUSH/WARMER DISC (DRAPES) ×3 IMPLANT
DRAPE SRG 135X102X78XABS (DRAPES) ×2 IMPLANT
DRSG COVADERM 4X14 (GAUZE/BANDAGES/DRESSINGS) ×3 IMPLANT
ELECT CAUTERY BLADE 6.4 (BLADE) ×3 IMPLANT
ELECT LOOP CUT MONO 26F .012 R (MISCELLANEOUS) ×3 IMPLANT
ELECT REM PT RETURN 9FT ADLT (ELECTROSURGICAL) ×6
ELECTRODE REM PT RTRN 9FT ADLT (ELECTROSURGICAL) ×4 IMPLANT
GAUZE SPONGE 4X4 12PLY STRL (GAUZE/BANDAGES/DRESSINGS) ×6 IMPLANT
GLOVE BIO SURGEON STRL SZ 6 (GLOVE) ×9 IMPLANT
GLOVE BIO SURGEON STRL SZ 6.5 (GLOVE) IMPLANT
GLOVE BIO SURGEON STRL SZ7 (GLOVE) IMPLANT
GLOVE BIO SURGEON STRL SZ7.5 (GLOVE) ×6 IMPLANT
GLOVE BIOGEL PI IND STRL 6 (GLOVE) ×6 IMPLANT
GLOVE BIOGEL PI IND STRL 6.5 (GLOVE) IMPLANT
GLOVE BIOGEL PI IND STRL 7.0 (GLOVE) IMPLANT
GLOVE BIOGEL PI INDICATOR 6 (GLOVE) ×3
GLOVE BIOGEL PI INDICATOR 6.5 (GLOVE)
GLOVE BIOGEL PI INDICATOR 7.0 (GLOVE)
GLOVE EUDERMIC 7 POWDERFREE (GLOVE) ×6 IMPLANT
GLOVE ORTHO TXT STRL SZ7.5 (GLOVE) IMPLANT
GOWN STRL REUS W/ TWL LRG LVL3 (GOWN DISPOSABLE) ×16 IMPLANT
GOWN STRL REUS W/ TWL XL LVL3 (GOWN DISPOSABLE) ×2 IMPLANT
GOWN STRL REUS W/TWL LRG LVL3 (GOWN DISPOSABLE) ×8
GOWN STRL REUS W/TWL XL LVL3 (GOWN DISPOSABLE) ×1
HEMOSTAT POWDER SURGIFOAM 1G (HEMOSTASIS) ×9 IMPLANT
HEMOSTAT SURGICEL 2X14 (HEMOSTASIS) ×3 IMPLANT
INSERT FOGARTY 61MM (MISCELLANEOUS) IMPLANT
INSERT FOGARTY XLG (MISCELLANEOUS) IMPLANT
KIT BASIN OR (CUSTOM PROCEDURE TRAY) ×3 IMPLANT
KIT CATH CPB BARTLE (MISCELLANEOUS) ×3 IMPLANT
KIT ROOM TURNOVER OR (KITS) ×3 IMPLANT
KIT SUCTION CATH 14FR (SUCTIONS) ×3 IMPLANT
KIT VASOVIEW W/TROCAR VH 2000 (KITS) ×3 IMPLANT
NS IRRIG 1000ML POUR BTL (IV SOLUTION) ×15 IMPLANT
PACK OPEN HEART (CUSTOM PROCEDURE TRAY) ×3 IMPLANT
PAD ARMBOARD 7.5X6 YLW CONV (MISCELLANEOUS) ×6 IMPLANT
PAD ELECT DEFIB RADIOL ZOLL (MISCELLANEOUS) ×3 IMPLANT
PENCIL BUTTON HOLSTER BLD 10FT (ELECTRODE) ×3 IMPLANT
PUNCH AORTIC ROTATE  4.5MM 8IN (MISCELLANEOUS) ×3 IMPLANT
PUNCH AORTIC ROTATE 4.0MM (MISCELLANEOUS) IMPLANT
PUNCH AORTIC ROTATE 4.5MM 8IN (MISCELLANEOUS) ×3 IMPLANT
PUNCH AORTIC ROTATE 5MM 8IN (MISCELLANEOUS) IMPLANT
SET CARDIOPLEGIA MPS 5001102 (MISCELLANEOUS) ×3 IMPLANT
SPONGE INTESTINAL PEANUT (DISPOSABLE) IMPLANT
SPONGE LAP 18X18 X RAY DECT (DISPOSABLE) IMPLANT
SPONGE LAP 4X18 X RAY DECT (DISPOSABLE) ×3 IMPLANT
SUT BONE WAX W31G (SUTURE) ×3 IMPLANT
SUT ETHIBOND 2 0 SH (SUTURE) ×4
SUT ETHIBOND 2 0 SH 36X2 (SUTURE) ×8 IMPLANT
SUT MNCRL AB 4-0 PS2 18 (SUTURE) IMPLANT
SUT PROLENE 3 0 SH DA (SUTURE) IMPLANT
SUT PROLENE 3 0 SH1 36 (SUTURE) ×3 IMPLANT
SUT PROLENE 4 0 RB 1 (SUTURE) ×1
SUT PROLENE 4 0 SH DA (SUTURE) IMPLANT
SUT PROLENE 4-0 RB1 .5 CRCL 36 (SUTURE) ×2 IMPLANT
SUT PROLENE 5 0 C 1 36 (SUTURE) ×6 IMPLANT
SUT PROLENE 6 0 C 1 30 (SUTURE) ×6 IMPLANT
SUT PROLENE 7 0 BV 1 (SUTURE) IMPLANT
SUT PROLENE 7 0 BV1 MDA (SUTURE) ×6 IMPLANT
SUT PROLENE 8 0 BV175 6 (SUTURE) ×6 IMPLANT
SUT SILK  1 MH (SUTURE) ×3
SUT SILK 1 MH (SUTURE) ×6 IMPLANT
SUT SILK 1 TIES 10X30 (SUTURE) ×3 IMPLANT
SUT SILK 2 0 SH CR/8 (SUTURE) ×6 IMPLANT
SUT SILK 2 0 TIES 10X30 (SUTURE) ×3 IMPLANT
SUT SILK 2 0 TIES 17X18 (SUTURE) ×1
SUT SILK 2-0 18XBRD TIE BLK (SUTURE) ×2 IMPLANT
SUT SILK 3 0 SH CR/8 (SUTURE) ×3 IMPLANT
SUT SILK 4 0 TIE 10X30 (SUTURE) ×6 IMPLANT
SUT STEEL STERNAL CCS#1 18IN (SUTURE) IMPLANT
SUT STEEL SZ 6 DBL 3X14 BALL (SUTURE) IMPLANT
SUT TEM PAC WIRE 2 0 SH (SUTURE) ×12 IMPLANT
SUT VIC AB 1 CTX 36 (SUTURE) ×2
SUT VIC AB 1 CTX36XBRD ANBCTR (SUTURE) ×4 IMPLANT
SUT VIC AB 2-0 CT1 27 (SUTURE) ×1
SUT VIC AB 2-0 CT1 TAPERPNT 27 (SUTURE) ×2 IMPLANT
SUT VIC AB 2-0 CTX 27 (SUTURE) ×6 IMPLANT
SUT VIC AB 3-0 SH 27 (SUTURE)
SUT VIC AB 3-0 SH 27X BRD (SUTURE) IMPLANT
SUT VIC AB 3-0 X1 27 (SUTURE) ×9 IMPLANT
SUT VICRYL 4-0 PS2 18IN ABS (SUTURE) IMPLANT
SUTURE E-PAK OPEN HEART (SUTURE) ×3 IMPLANT
SYSTEM SAHARA CHEST DRAIN ATS (WOUND CARE) ×3 IMPLANT
TAPE CLOTH SURG 4X10 WHT LF (GAUZE/BANDAGES/DRESSINGS) ×3 IMPLANT
TOWEL OR 17X24 6PK STRL BLUE (TOWEL DISPOSABLE) ×3 IMPLANT
TOWEL OR 17X26 10 PK STRL BLUE (TOWEL DISPOSABLE) ×3 IMPLANT
TRAY FOLEY IC TEMP SENS 16FR (CATHETERS) ×3 IMPLANT
TUBING INSUFFLATION (TUBING) ×3 IMPLANT
UNDERPAD 30X30 INCONTINENT (UNDERPADS AND DIAPERS) ×3 IMPLANT
WATER STERILE IRR 1000ML POUR (IV SOLUTION) ×6 IMPLANT

## 2015-09-23 NOTE — Progress Notes (Signed)
Pt too drowsy to extubate at 1735. RRT switched vent settings back to full support.  Will continue to monitor.  Peyton Bottomsachel R Akeyla Molden, RN 6:19 PM 09/23/2015

## 2015-09-23 NOTE — Anesthesia Preprocedure Evaluation (Signed)
Anesthesia Evaluation  Patient identified by MRN, date of birth, ID band Patient awake    Reviewed: Allergy & Precautions, NPO status , Patient's Chart, lab work & pertinent test results  Airway Mallampati: II   Neck ROM: full    Dental   Pulmonary sleep apnea , former smoker,    breath sounds clear to auscultation       Cardiovascular hypertension, + angina + CAD, + Past MI and + Cardiac Stents  + pacemaker  Rhythm:regular Rate:Normal  H/o complete heart block.   Neuro/Psych  Headaches,    GI/Hepatic   Endo/Other  Hypothyroidism obese  Renal/GU      Musculoskeletal   Abdominal   Peds  Hematology   Anesthesia Other Findings   Reproductive/Obstetrics                             Anesthesia Physical Anesthesia Plan  ASA: III  Anesthesia Plan: General   Post-op Pain Management:    Induction: Intravenous  Airway Management Planned: Oral ETT  Additional Equipment: Arterial line, CVP, PA Cath, TEE and Ultrasound Guidance Line Placement  Intra-op Plan:   Post-operative Plan: Post-operative intubation/ventilation  Informed Consent: I have reviewed the patients History and Physical, chart, labs and discussed the procedure including the risks, benefits and alternatives for the proposed anesthesia with the patient or authorized representative who has indicated his/her understanding and acceptance.     Plan Discussed with: CRNA, Anesthesiologist and Surgeon  Anesthesia Plan Comments:         Anesthesia Quick Evaluation

## 2015-09-23 NOTE — Anesthesia Postprocedure Evaluation (Signed)
Anesthesia Post Note  Patient: Jose [name]  Procedure(s) Performed: Procedure(s) (LRB): CORONARY ARTERY BYPASS GRAFTING (CABG), ON PUMP, TIMES FOUR, USING LEFT INTERNAL MAMMARY ARTERY, RIGHT GREATER SAPHENOUS VEIN HARVESTED ENDOSCOPICALLY (N/A) TRANSESOPHAGEAL ECHOCARDIOGRAM (TEE) (N/A)  Patient location during evaluation: ICU Anesthesia Type: General Level of consciousness: sedated and patient remains intubated per anesthesia plan Pain management: pain level controlled Vital Signs Assessment: post-procedure vital signs reviewed and stable Respiratory status: patient remains intubated per anesthesia plan Cardiovascular status: stable Anesthetic complications: no    Last Vitals:  Filed Vitals:   09/23/15 1315 09/23/15 1330  BP:    Pulse: 80 80  Temp: 36.9 C 36.7 C  Resp: 14 12    Last Pain: There were no vitals filed for this visit.               Suraya Vidrine S

## 2015-09-23 NOTE — Brief Op Note (Signed)
09/23/2015  11:10 AM      [address]       [place] [id number]             [phone]     09/23/2015  11:10 AM  PATIENT:  Jose FrederickMac D Grafton  74 y.o. male  PRE-OPERATIVE DIAGNOSIS:  CAD  POST-OPERATIVE DIAGNOSIS:  CAD  PROCEDURE:  Procedure(s): CORONARY ARTERY BYPASS GRAFTING (CABG), ON PUMP, TIMES FOUR, USING LEFT INTERNAL MAMMARY ARTERY, RIGHT GREATER SAPHENOUS VEIN HARVESTED ENDOSCOPICALLY (EVH). LIMA-LAD; SVG-OM; SVG-DIAG; SVG-PD TRANSESOPHAGEAL ECHOCARDIOGRAM (TEE)  SURGEON:  Surgeon(s): Alleen BorneBryan K Bartle, MD  PHYSICIAN ASSISTANT: Somya Jauregui PA-C  ANESTHESIA:   general  PATIENT CONDITION:  ICU - intubated and hemodynamically stable.  PRE-OPERATIVE WEIGHT: 109kg  EBL/TRANSFUSION: SEE ANEST/PERFUSION RECORDS  COMPLICATIONS: NO KNOWN

## 2015-09-23 NOTE — Progress Notes (Signed)
RT observed ETT at 21cm lip (originally at 23cm lip)- RT advanced ETT to 23 cm lip (resulted in BBS, Sp02 100, receiving full VT from Vent). RT also applies Holister ETT holder.

## 2015-09-23 NOTE — Progress Notes (Signed)
Precedex off at 16:35, pt now awakens easily to voice and follows commands. RRT switched ventilator to CPAP/PS at 17:02. Pt tolerating well, will continue to monitor.   Peyton Bottomsachel R Liyah Higham, RN 5:15 PM 09/23/2015

## 2015-09-23 NOTE — Progress Notes (Signed)
Patient is now on Pressure Support/CPAP 10/5 and 40% and tolerating very well. RT continuing to monitor at bedside.

## 2015-09-23 NOTE — Op Note (Signed)
CARDIOVASCULAR SURGERY OPERATIVE NOTE  09/23/2015  Surgeon:  Alleen Borne, MD  First Assistant: Gershon Crane,  PA-C   Preoperative Diagnosis:  Severe multi-vessel coronary artery disease   Postoperative Diagnosis:  Same   Procedure:   Median Sternotomy  Extracorporeal circulation 3.   Coronary artery bypass grafting x 4   Left internal mammary graft to the LAD  SVG to diagonal  SVG to OM 2  SVG to PDA 4.   Endoscopic vein harvest from the right leg   Anesthesia:  General Endotracheal   Clinical History/Surgical Indication:  The patient is a 74 year old gentleman with a history of hypertension, hypercholesterolemia, hypothyroidism, sick sinus syndrome s/p PPM and coronary artery disease s/p MI in 2002 followed by stenting of his RCA. He had moderate disease in the LAD and LCX at that time. He had a cath in 2008 and the stent was reportedly patent. He now presents with fatigue and shortness of breath with exertion over the past several months. He has had no chest pain or pressure. He does report some numbness and pain in his arms but it usually occurs at night while sleeping and he has been told that he has bilateral carpal tunnel. He had a stress test showing:   There was no ST segment deviation noted during stress.  Defect 1: There is a defect present in the apex location.  There is a fixed apical defect with no reversibility. This could be due to an artifact given intense GI uptake affecting the quality of study. However, prior apical infarct can not be excluded.  The left ventricular ejection fraction is moderately decreased (30-44%). Calculated EF was 26% but there was poor tracking  This is an intermediate risk study.  Correlate clinically and recommend an echo to better evaluate LVSF.   An echo showed an EF of 40-45% with probable severe hypokinesis of  the mid-apicalanterior, anterolateral, inferoseptal, and apical Myocardium. There was no AS or AI and no MR.  Cardiac cath on 09/12/2015 showed severe 3-vessel coronary disease:   Mid RCA lesion, 85% stenosed. The lesion was previously treated with a stent (unknown type).  Mid Cx lesion, 80% stenosed.  LM lesion, 80% stenosed.  1st Diag lesion, 90% stenosed.  Prox LAD lesion, 60% stenosed  He has severe 3- vessel coronary artery disease with moderate LV dysfunction and a markedly abnormal stress test presenting with progressive exertional fatigue and shortness of breath. I agree that CABG is the best treatment for him for relief of symptoms and survival benefit. I discussed the operative procedure with the patient and his wife including alternatives, benefits and risks; including but not limited to bleeding, blood transfusion, infection, stroke, myocardial infarction, graft failure, heart block requiring a permanent pacemaker, organ dysfunction, and death. Jose [name] understands and agrees to proceed.   Preparation:  The patient was seen in the preoperative holding area and the correct patient, correct operation were confirmed with the patient after reviewing the medical record and catheterization. The consent was signed by me. Preoperative antibiotics were given. A pulmonary arterial line and radial arterial line were placed by the anesthesia team. The patient was taken back to the operating room and positioned supine on the operating room table. After being placed under general endotracheal anesthesia by the anesthesia team a foley catheter was placed. The neck, chest, abdomen, and both legs were prepped with betadine soap and solution and draped in the usual sterile manner. A surgical time-out was taken and the  correct patient and operative procedure were confirmed with the nursing and anesthesia staff.  TEE: performed by Dr. Achille RichAdam Hodierne. This showed an LVEF of 40-45%, no MR, no AS,  no AI.   Cardiopulmonary Bypass:  A median sternotomy was performed. The pericardium was opened in the midline. Right ventricular function appeared normal. The ascending aorta was of normal size and had no palpable plaque but the wall was thickened and there was a moderate amount of fat covering the proximal aorta. There were no contraindications to aortic cannulation or cross-clamping. The patient was fully systemically heparinized and the ACT was maintained > 400 sec. The proximal aortic arch was cannulated with a 20 F aortic cannula for arterial inflow. Venous cannulation was performed via the right atrial appendage using a two-staged venous cannula. An antegrade cardioplegia/vent cannula was inserted into the mid-ascending aorta. Aortic occlusion was performed with a single cross-clamp. Systemic cooling to 32 degrees Centigrade and topical cooling of the heart with iced saline were used. Hyperkalemic antegrade cold blood cardioplegia was used to induce diastolic arrest and was then given at about 20 minute intervals throughout the period of arrest to maintain myocardial temperature at or below 10 degrees centigrade. A temperature probe was inserted into the interventricular septum and an insulating pad was placed in the pericardium.   Left internal mammary harvest:  The left side of the sternum was retracted using the Rultract retractor. The left internal mammary artery was harvested as a pedicle graft. All side branches were clipped. It was a medium-sized vessel of good quality with excellent blood flow. It was ligated distally and divided. It was sprayed with topical papaverine solution to prevent vasospasm.   Endoscopic vein harvest:  The right greater saphenous vein was harvested endoscopically through a 2 cm incision medial to the right knee. It was harvested from the upper thigh to below the knee. It was a medium-sized vein of fair quality with slightly thickened wall. The side branches were  all ligated with 4-0 silk ties.    Coronary arteries:  The coronary arteries were examined. Large amount of epicardial fat.   LAD:  Diffusely diseased throughout the proximal and mid-portions. Distal was free of disease. The diagonal is a large vessel that was heavily diseased proximally but distally was a good vessel.  LCX:  OM1 small and not graftable. OM2 moderate sized vessel that was diseased proximally but graftable distally.  RCA:  Diffusely diseased with calcific plaque that extended out into the mid-portion of the PDA.   Grafts:  1. LIMA to the LAD: 2.5 mm. It was sewn end to side using 8-0 prolene continuous suture. 2. SVG to diagonal:  1.75 mm. It was sewn end to side using 7-0 prolene continuous suture. 3. SVG to OM:  1.75 mm. It was sewn end to side using 7-0 prolene continuous suture. 4. SVG to PDA:  1.6 mm. It was sewn end to side using 7-0 prolene continuous suture.  The proximal vein graft anastomoses of the PDA and diagonal grafts were performed to the mid-ascending aorta using continuous 6-0 prolene suture. The OM vein graft was too short to reach back to the aorta. The proximal anastomosis of the OM vein graft was performed to the proximal portion of the diagonal vein graft in an end to side manner using continuous 7-0 prolene suture. Graft markers were placed around the proximal anastomoses on the aorta.   Completion:  The patient was rewarmed to 37 degrees Centigrade. The clamp was removed from  the LIMA pedicle and there was rapid warming of the septum and return of ventricular fibrillation. The crossclamp was removed with a time of 93 minutes. There was spontaneous return of sinus rhythm. The distal and proximal anastomoses were checked for hemostasis. The position of the grafts was satisfactory. Two temporary epicardial pacing wires were placed on the right atrium and two on the right ventricle. The patient was weaned from CPB without difficulty on no inotropes. CPB  time was 114 minutes. Cardiac output was 5 LPM. TEE showed good LV function with no MR.  Heparin was fully reversed with protamine and the aortic and venous cannulas removed. Hemostasis was achieved. Mediastinal and left pleural drainage tubes were placed. The sternum was closed with double #6 stainless steel wires. The fascia was closed with continuous # 1 vicryl suture. The subcutaneous tissue was closed with 2-0 vicryl continuous suture. The skin was closed with 3-0 vicryl subcuticular suture. All sponge, needle, and instrument counts were reported correct at the end of the case. Dry sterile dressings were placed over the incisions and around the chest tubes which were connected to pleurevac suction. The patient was then transported to the surgical intensive care unit in critical but stable condition.

## 2015-09-23 NOTE — Progress Notes (Signed)
Utilization Review Completed.Jose [name] T12/27/2016  

## 2015-09-23 NOTE — Progress Notes (Signed)
  Echocardiogram Echocardiogram Transesophageal has been performed.  Jose [name], Jose [name] 09/23/2015, 8:16 AM

## 2015-09-23 NOTE — Progress Notes (Signed)
Reduced patient's rate to 4 per Rapid Wean Protocol. Patient is tolerating well and RT will continue to monitor at bedside.

## 2015-09-23 NOTE — OR Nursing (Signed)
SICU notified of 20 minute call

## 2015-09-23 NOTE — Progress Notes (Signed)
      [address]       [place] [id number]             [phone]      S/p CABG x 4  Intubated, starting to wean  BP 94/64 mmHg  Pulse 80  Temp(Src) 98.2 F (36.8 C) (Core (Comment))  Resp 12  Ht 6\' 1"  (1.854 m)  Wt 242 lb (109.77 kg)  BMI 31.93 kg/m2  SpO2 100%  25/15 CO= 4.2, CI= 1,84   Intake/Output Summary (Last 24 hours) at 09/23/15 1641 Last data filed at 09/23/15 1600  Gross per 24 hour  Intake 3435.51 ml  Output   3255 ml  Net 180.51 ml   K= 4.1, Hct= 39  Doing well early postop  Viviann SpareSteven C. Dorris FetchHendrickson, MD [facility] [phone]

## 2015-09-23 NOTE — Anesthesia Procedure Notes (Addendum)
Central Venous Catheter Insertion Performed by: anesthesiologist Patient location: Pre-op. Preanesthetic checklist: patient identified, IV checked, site marked, risks and benefits discussed, surgical consent, monitors and equipment checked, pre-op evaluation, timeout performed and anesthesia consent Lidocaine 1% used for infiltration Landmarks identified Catheter size: 8.5 Fr Central line was placed.Sheath introducer Swan type and PA catheter depth:50PA Cath depth:50 Procedure performed using ultrasound guided technique. Attempts: 1 Following insertion, line sutured and dressing applied. Post procedure assessment: blood return through all ports, free fluid flow and no air. Patient tolerated the procedure well with no immediate complications.    Central Venous Catheter Insertion Performed by: anesthesiologist Patient location: Pre-op. Preanesthetic checklist: patient identified, IV checked, site marked, risks and benefits discussed, surgical consent, monitors and equipment checked, pre-op evaluation, timeout performed and anesthesia consent Position: Trendelenburg Landmarks identified PA cath was placed.Swan type and PA catheter depth:thermodilation and 50PA Cath depth:50 Procedure performed using ultrasound guided technique. Attempts: 1 Patient tolerated the procedure well with no immediate complications.    Procedure Name: Intubation Date/Time: 09/23/2015 7:55 AM Performed by: Virgel GessHOLTZMAN, Balraj Brayfield LEFFEW Pre-anesthesia Checklist: Patient identified, Patient being monitored, Timeout performed, Emergency Drugs available and Suction available Patient Re-evaluated:Patient Re-evaluated prior to inductionOxygen Delivery Method: Circle System Utilized Preoxygenation: Pre-oxygenation with 100% oxygen Intubation Type: IV induction Ventilation: Mask ventilation without difficulty Laryngoscope Size: Ifeoluwa and 4 Grade View: Grade II Tube type: Oral Tube size: 8.5 mm Number of attempts:  1 Airway Equipment and Method: Stylet Placement Confirmation: ETT inserted through vocal cords under direct vision,  positive ETCO2 and breath sounds checked- equal and bilateral Secured at: 23 cm Tube secured with: Tape Dental Injury: Teeth and Oropharynx as per pre-operative assessment

## 2015-09-23 NOTE — OR Nursing (Signed)
Notified of on the way call

## 2015-09-23 NOTE — Procedures (Signed)
Extubation Procedure Note  Patient Details:   Name: Jose [name] DOB: July 09, 1941 MRN: [id number]   Airway Documentation:  Airway 8.5 mm (Active)  Secured at (cm) 23 cm 09/23/2015  9:17 PM  Measured From Lips 09/23/2015  9:17 PM  Secured Location Right 09/23/2015  9:17 PM  Secured By [employer] Tube Holder 09/23/2015  8:57 PM  Tube Holder Repositioned Yes 09/23/2015  8:57 PM  Site Condition Dry 09/23/2015  8:57 PM    Evaluation  O2 sats: stable throughout Complications: No apparent complications Patient did tolerate procedure well. Bilateral Breath Sounds: Clear Suctioning: Airway Yes    Patient was extubated following Rapid Wean Protocol, and tolerated it well. Patient was placed on 4L nasal cannula following extubation and has an SpO2 of 96%. Vitals remained stable throughout procedure and RT will continue to monitor.  Jose [name] 09/23/2015, 9:52 PM

## 2015-09-23 NOTE — Transfer of Care (Signed)
Immediate Anesthesia Transfer of Care Note  Patient: Jose [name]  Procedure(s) Performed: Procedure(s): CORONARY ARTERY BYPASS GRAFTING (CABG), ON PUMP, TIMES FOUR, USING LEFT INTERNAL MAMMARY ARTERY, RIGHT GREATER SAPHENOUS VEIN HARVESTED ENDOSCOPICALLY (N/A) TRANSESOPHAGEAL ECHOCARDIOGRAM (TEE) (N/A)  Patient Location: ICU  Anesthesia Type:General  Level of Consciousness: Patient remains intubated per anesthesia plan  Airway & Oxygen Therapy: Patient remains intubated per anesthesia plan  Post-op Assessment: Report given to RN and Post -op Vital signs reviewed and stable  Post vital signs: Reviewed and stable  Last Vitals:  Filed Vitals:   09/23/15 0604 09/23/15 1310  BP: 145/76   Pulse: 75 80  Temp: 36.7 C 36.9 C  Resp: 18 14    Complications: No apparent anesthesia complications

## 2015-09-23 NOTE — Interval H&P Note (Signed)
History and Physical Interval Note:  09/23/2015 6:34 AM  Jose [name]  has presented today for surgery, with the diagnosis of CAD  The various methods of treatment have been discussed with the patient and family. After consideration of risks, benefits and other options for treatment, the patient has consented to  Procedure(s): CORONARY ARTERY BYPASS GRAFTING (CABG) (N/A) TRANSESOPHAGEAL ECHOCARDIOGRAM (TEE) (N/A) as a surgical intervention .  The patient's history has been reviewed, patient examined, no change in status, stable for surgery.  I have reviewed the patient's chart and labs.  Questions were answered to the patient's satisfaction.     Alleen BorneBryan K Bartle

## 2015-09-23 NOTE — OR Nursing (Signed)
Forty-five minute call to SICU charge nurse at 1154.

## 2015-09-24 ENCOUNTER — Encounter (HOSPITAL_COMMUNITY): Payer: Self-pay | Admitting: Radiology

## 2015-09-24 ENCOUNTER — Inpatient Hospital Stay (HOSPITAL_COMMUNITY): Payer: Medicare Other

## 2015-09-24 ENCOUNTER — Encounter: Payer: Medicare Other | Admitting: Surgery

## 2015-09-24 LAB — BASIC METABOLIC PANEL
Anion gap: 6 (ref 5–15)
BUN: 8 mg/dL (ref 6–20)
CALCIUM: 8.1 mg/dL — AB (ref 8.9–10.3)
CHLORIDE: 103 mmol/L (ref 101–111)
CO2: 26 mmol/L (ref 22–32)
CREATININE: 0.81 mg/dL (ref 0.61–1.24)
Glucose, Bld: 130 mg/dL — ABNORMAL HIGH (ref 65–99)
Potassium: 4.2 mmol/L (ref 3.5–5.1)
SODIUM: 135 mmol/L (ref 135–145)

## 2015-09-24 LAB — POCT I-STAT, CHEM 8
BUN: 9 mg/dL (ref 6–20)
Calcium, Ion: 1.22 mmol/L (ref 1.13–1.30)
Chloride: 99 mmol/L — ABNORMAL LOW (ref 101–111)
Creatinine, Ser: 0.8 mg/dL (ref 0.61–1.24)
Glucose, Bld: 140 mg/dL — ABNORMAL HIGH (ref 65–99)
HCT: 38 % — ABNORMAL LOW (ref 39.0–52.0)
Hemoglobin: 12.9 g/dL — ABNORMAL LOW (ref 13.0–17.0)
Potassium: 4.4 mmol/L (ref 3.5–5.1)
Sodium: 135 mmol/L (ref 135–145)
TCO2: 28 mmol/L (ref 0–100)

## 2015-09-24 LAB — CREATININE, SERUM: CREATININE: 0.86 mg/dL (ref 0.61–1.24)

## 2015-09-24 LAB — CBC
HCT: 36.2 % — ABNORMAL LOW (ref 39.0–52.0)
HCT: 37.7 % — ABNORMAL LOW (ref 39.0–52.0)
HEMOGLOBIN: 12 g/dL — AB (ref 13.0–17.0)
Hemoglobin: 12.8 g/dL — ABNORMAL LOW (ref 13.0–17.0)
MCH: 32.1 pg (ref 26.0–34.0)
MCH: 33.3 pg (ref 26.0–34.0)
MCHC: 33.1 g/dL (ref 30.0–36.0)
MCHC: 34 g/dL (ref 30.0–36.0)
MCV: 96.8 fL (ref 78.0–100.0)
MCV: 98.2 fL (ref 78.0–100.0)
Platelets: 116 10*3/uL — ABNORMAL LOW (ref 150–400)
Platelets: 103 K/uL — ABNORMAL LOW (ref 150–400)
RBC: 3.74 MIL/uL — ABNORMAL LOW (ref 4.22–5.81)
RBC: 3.84 MIL/uL — ABNORMAL LOW (ref 4.22–5.81)
RDW: 13.5 % (ref 11.5–15.5)
RDW: 13.6 % (ref 11.5–15.5)
WBC: 9.2 10*3/uL (ref 4.0–10.5)
WBC: 11.5 K/uL — ABNORMAL HIGH (ref 4.0–10.5)

## 2015-09-24 LAB — GLUCOSE, CAPILLARY
GLUCOSE-CAPILLARY: 111 mg/dL — AB (ref 65–99)
Glucose-Capillary: 112 mg/dL — ABNORMAL HIGH (ref 65–99)
Glucose-Capillary: 117 mg/dL — ABNORMAL HIGH (ref 65–99)
Glucose-Capillary: 125 mg/dL — ABNORMAL HIGH (ref 65–99)
Glucose-Capillary: 130 mg/dL — ABNORMAL HIGH (ref 65–99)
Glucose-Capillary: 145 mg/dL — ABNORMAL HIGH (ref 65–99)
Glucose-Capillary: 159 mg/dL — ABNORMAL HIGH (ref 65–99)
Glucose-Capillary: 83 mg/dL (ref 65–99)

## 2015-09-24 LAB — MAGNESIUM
MAGNESIUM: 2.1 mg/dL (ref 1.7–2.4)
Magnesium: 2.2 mg/dL (ref 1.7–2.4)

## 2015-09-24 MED ORDER — KETOROLAC TROMETHAMINE 15 MG/ML IJ SOLN
15.0000 mg | Freq: Four times a day (QID) | INTRAMUSCULAR | Status: AC
  Administered 2015-09-24 – 2015-09-25 (×3): 15 mg via INTRAVENOUS
  Filled 2015-09-24 (×3): qty 1

## 2015-09-24 MED ORDER — MIDAZOLAM HCL 2 MG/2ML IJ SOLN
INTRAMUSCULAR | Status: AC
  Filled 2015-09-24: qty 2

## 2015-09-24 MED ORDER — INSULIN ASPART 100 UNIT/ML ~~LOC~~ SOLN
0.0000 [IU] | SUBCUTANEOUS | Status: DC
  Administered 2015-09-24 (×3): 2 [IU] via SUBCUTANEOUS

## 2015-09-24 MED ORDER — ENOXAPARIN SODIUM 40 MG/0.4ML ~~LOC~~ SOLN
40.0000 mg | Freq: Every day | SUBCUTANEOUS | Status: DC
  Administered 2015-09-24 – 2015-09-27 (×4): 40 mg via SUBCUTANEOUS
  Filled 2015-09-24 (×4): qty 0.4

## 2015-09-24 MED ORDER — IOHEXOL 350 MG/ML SOLN
100.0000 mL | Freq: Once | INTRAVENOUS | Status: AC | PRN
  Administered 2015-09-24: 100 mL via INTRAVENOUS

## 2015-09-24 MED ORDER — GABAPENTIN 100 MG PO CAPS
200.0000 mg | ORAL_CAPSULE | Freq: Two times a day (BID) | ORAL | Status: DC
  Administered 2015-09-24 – 2015-09-27 (×6): 200 mg via ORAL
  Filled 2015-09-24 (×6): qty 2

## 2015-09-24 MED ORDER — INSULIN ASPART 100 UNIT/ML ~~LOC~~ SOLN: 0.0000 [IU] | SUBCUTANEOUS | Status: DC

## 2015-09-24 NOTE — Progress Notes (Signed)
Pt reports exacerbated cramping pain in right groin/hip area that worsens to 10/10 sharp/shooting pain with movement. Pt says he has had the pain before coming to the hospital but that it is worse now. Myself and another RN, Joni Reiningicole, looked at the area as well as pt's flank; no abnormalities noted. Repositioned pt with pillow under affected hip; pt reported some brief relief. Gave PRN tramadol and oxycodone (see eMAR for details). All vitals stable at this time. Will continue to monitor pt's pain and condition.   Peyton Bottomsachel R Cassandra Harbold, RN 10:46 AM 09/24/2015

## 2015-09-24 NOTE — Progress Notes (Signed)
Patient ID: Jose [name], male   DOB: Apr 24, 1941, 74 y.o.   MRN: [id number]  CTA of abdomen and pelvis reviewed. There is no retroperitoneal hematoma and aorta, iliac and femoral arteries are unremarkable. Radiologist commented about differential contrast in femoral veins and possibility of AV fistula but he has no groin mass and no bruit. I doubt that. His pain is somewhat better. Continue pain meds and observation for now. Mobilize out of bed.

## 2015-09-24 NOTE — Progress Notes (Signed)
CT surgery p.m. Rounds  Stable hemodynamics, normal sinus rhythm Lungs clear  Patient complains of severe right thigh pain CT scan reviewed with Dr. Lavinia SharpsBartel in shows no significant pathology of the retroperitoneum or groin or hip Exam without hematoma. Endovascular vein harvest tunnel appears without abnormality noted Symptoms may be related to neuritic pain-irritation of the lateral femoral cutaneous nerve. Will start by mouth Neurontin and give 2 doses of IV Toradol

## 2015-09-24 NOTE — Progress Notes (Deleted)
CRITICAL VALUE ALERT  Critical value received:  Lactic Acid 15.6  Date of notification:  09/24/2015  Time of notification:  11:30  Critical value read back:Yes.    Nurse who received alert:  Kennith Centerachel Yandiel Bergum RN  MD notified (1st page):  NP Earnest RosierBrandy Ollis notified at bedside  Time of first page:  11:31  Responding MD: NP Earnest RosierBrandy Ollis  Time MD responded:  11:31, she was at bedside

## 2015-09-24 NOTE — Progress Notes (Signed)
Applied ice pack to pt's right hip area. Gave handoff report to RN Thayer OhmChris, informed him of the pain issues and what interventions were done so far.   Peyton Bottomsachel R Stewart Sasaki, RN 11:18 AM 09/24/2015

## 2015-09-24 NOTE — Progress Notes (Signed)
1 Day Post-Op Procedure(s) (LRB): CORONARY ARTERY BYPASS GRAFTING (CABG), ON PUMP, TIMES FOUR, USING LEFT INTERNAL MAMMARY ARTERY, RIGHT GREATER SAPHENOUS VEIN HARVESTED ENDOSCOPICALLY (N/A) TRANSESOPHAGEAL ECHOCARDIOGRAM (TEE) (N/A) Subjective:  Sore, complains of numbness in hands. He has bilateral carpal tunnel that has caused bilateral hand numbness for a while.  Objective: Vital signs in last 24 hours: Temp:  [98.1 F (36.7 C)-100.9 F (38.3 C)] 99.5 F (37.5 C) (12/28 0725) Pulse Rate:  [80-102] 102 (12/28 0725) Cardiac Rhythm:  [-] A-V Sequential paced (12/28 0400) Resp:  [10-18] 18 (12/28 0725) BP: (69-129)/(51-86) 107/61 mmHg (12/28 0700) SpO2:  [92 %-100 %] 95 % (12/28 0725) Arterial Line BP: (73-162)/(44-86) 112/53 mmHg (12/28 0725) FiO2 (%):  [40 %-50 %] 40 % (12/27 2117) Weight:  [109.5 kg (241 lb 6.5 oz)] 109.5 kg (241 lb 6.5 oz) (12/28 0500)  Hemodynamic parameters for last 24 hours: PAP: (19-36)/(9-23) 29/11 mmHg CO:  [4.3 L/min-6.9 L/min] 6.4 L/min CI:  [1.8 L/min/m2-3 L/min/m2] 2.8 L/min/m2  Intake/Output from previous day: 12/27 0701 - 12/28 0700 In: 4763.8 [P.O.:210; I.V.:3123.8; Blood:550; NG/GT:30; IV Piggyback:850] Out: 5150 [Urine:3530; Emesis/NG output:50; Blood:1000; Chest Tube:570] Intake/Output this shift:    General appearance: alert and cooperative Neurologic: intact Heart: regular rate and rhythm, S1, S2 normal, no murmur, click, rub or gallop Lungs: clear to auscultation bilaterally Extremities: edema mild Wound: dressing dry  Lab Results:  Recent Labs  09/23/15 1851 09/24/15 0400  WBC 8.4 9.2  HGB 12.8* 12.0*  HCT 37.4* 36.2*  PLT 117* 116*   BMET:  Recent Labs  09/23/15 1847 09/23/15 1851 09/24/15 0400  NA 136  --  135  Jose 4.3  --  4.2  CL 104  --  103  CO2  --   --  26  GLUCOSE 121*  --  130*  BUN 10  --  8  CREATININE 0.70 0.78 0.81  CALCIUM  --   --  8.1*    PT/INR:  Recent Labs  09/23/15 1315  LABPROT 17.9*   INR 1.47   ABG    Component Value Date/Time   PHART 7.368 09/23/2015 2259   HCO3 23.3 09/23/2015 2259   TCO2 24 09/23/2015 2259   ACIDBASEDEF 2.0 09/23/2015 2259   O2SAT 92.0 09/23/2015 2259   CBG (last 3)   Recent Labs  09/24/15 0008 09/24/15 0348 09/24/15 0733  GLUCAP 83 111* 112*   CLINICAL DATA: Status post CABG  EXAM: PORTABLE CHEST 1 VIEW  COMPARISON: 09/23/2015  FINDINGS: Interval extubation.  Right IJ Swan-Ganz catheter terminates in the main pulmonary artery.  Left chest tube and mediastinal drain. No pneumothorax is seen.  Interval removal of enteric tube.  Cardiomegaly. Postsurgical changes related to CABG.  Left subclavian pacemaker.  IMPRESSION: Interval extubation and removal of enteric tube.  Left chest tube and mediastinal drain. No pneumothorax is seen.  Right IJ Swan-Ganz catheter terminates in the main pulmonary artery.   Electronically Signed  By: Charline Bills M.D.  On: 09/24/2015 07:41   Assessment/Plan: S/P Procedure(s) (LRB): CORONARY ARTERY BYPASS GRAFTING (CABG), ON PUMP, TIMES FOUR, USING LEFT INTERNAL MAMMARY ARTERY, RIGHT GREATER SAPHENOUS VEIN HARVESTED ENDOSCOPICALLY (N/A) TRANSESOPHAGEAL ECHOCARDIOGRAM (TEE) (N/A)  He is hemodynamically stable off neo. Internally paced sensing atrial and pacing ventricle in the 90's Mobilize Diuresis Diabetes control: preop Hgb A1c was 6.0 on no meds. Continue SSI d/c tubes/lines Continue foley due to patient in ICU and urinary output monitoring See progression orders   LOS: 1 day    Jose [name]  Jose [name] 09/24/2015

## 2015-09-24 NOTE — Progress Notes (Signed)
Patient ID: Jose [name], male   DOB: 1941/06/08, 74 y.o.   MRN: [id number]  He started complaining of severe pain in right leg starting this morning since I saw him earlier. He says it is 10/10 knife-like pain starting in the groin and extending down the right leg. It is made worse by any leg movement. He has never had it before and did not have it earlier today. His leg looks fine. Right groin ok with tiny knot at cath site. Good femoral and dp/pt pulses. Etiology is unclear. Will get a CTA of the abdomen and pelvis to rule out retroperitoneal hematoma since he had a cath a couple weeks ago. Discussed with patient and wife.

## 2015-09-25 ENCOUNTER — Inpatient Hospital Stay (HOSPITAL_COMMUNITY): Payer: Medicare Other

## 2015-09-25 LAB — CBC
HCT: 34.6 % — ABNORMAL LOW (ref 39.0–52.0)
Hemoglobin: 11.3 g/dL — ABNORMAL LOW (ref 13.0–17.0)
MCH: 32.1 pg (ref 26.0–34.0)
MCHC: 32.7 g/dL (ref 30.0–36.0)
MCV: 98.3 fL (ref 78.0–100.0)
PLATELETS: 99 10*3/uL — AB (ref 150–400)
RBC: 3.52 MIL/uL — AB (ref 4.22–5.81)
RDW: 13.6 % (ref 11.5–15.5)
WBC: 9.6 10*3/uL (ref 4.0–10.5)

## 2015-09-25 LAB — BASIC METABOLIC PANEL
Anion gap: 4 — ABNORMAL LOW (ref 5–15)
BUN: 11 mg/dL (ref 6–20)
CHLORIDE: 101 mmol/L (ref 101–111)
CO2: 28 mmol/L (ref 22–32)
CREATININE: 0.82 mg/dL (ref 0.61–1.24)
Calcium: 8.4 mg/dL — ABNORMAL LOW (ref 8.9–10.3)
Glucose, Bld: 118 mg/dL — ABNORMAL HIGH (ref 65–99)
POTASSIUM: 4.4 mmol/L (ref 3.5–5.1)
SODIUM: 133 mmol/L — AB (ref 135–145)

## 2015-09-25 LAB — GLUCOSE, CAPILLARY
Glucose-Capillary: 102 mg/dL — ABNORMAL HIGH (ref 65–99)
Glucose-Capillary: 114 mg/dL — ABNORMAL HIGH (ref 65–99)

## 2015-09-25 MED ORDER — KETOROLAC TROMETHAMINE 15 MG/ML IJ SOLN
15.0000 mg | Freq: Four times a day (QID) | INTRAMUSCULAR | Status: AC | PRN
  Administered 2015-09-25 – 2015-09-27 (×6): 15 mg via INTRAVENOUS
  Filled 2015-09-25 (×7): qty 1

## 2015-09-25 MED FILL — Lidocaine HCl IV Inj 20 MG/ML: INTRAVENOUS | Qty: 5 | Status: AC

## 2015-09-25 MED FILL — Sodium Chloride IV Soln 0.9%: INTRAVENOUS | Qty: 2000 | Status: AC

## 2015-09-25 MED FILL — Sodium Bicarbonate IV Soln 8.4%: INTRAVENOUS | Qty: 50 | Status: AC

## 2015-09-25 MED FILL — Electrolyte-R (PH 7.4) Solution: INTRAVENOUS | Qty: 4000 | Status: AC

## 2015-09-25 MED FILL — Heparin Sodium (Porcine) Inj 1000 Unit/ML: INTRAMUSCULAR | Qty: 10 | Status: AC

## 2015-09-25 MED FILL — Mannitol IV Soln 20%: INTRAVENOUS | Qty: 500 | Status: AC

## 2015-09-25 NOTE — Progress Notes (Signed)
2 Days Post-Op Procedure(s) (LRB): CORONARY ARTERY BYPASS GRAFTING (CABG), ON PUMP, TIMES FOUR, USING LEFT INTERNAL MAMMARY ARTERY, RIGHT GREATER SAPHENOUS VEIN HARVESTED ENDOSCOPICALLY (N/A) TRANSESOPHAGEAL ECHOCARDIOGRAM (TEE) (N/A) Subjective:  Right hip and leg pain much better with Toradol overnight. Up in chair. Able to lift leg some.  Objective: Vital signs in last 24 hours: Temp:  [97.3 F (36.3 C)-99.7 F (37.6 C)] 97.5 F (36.4 C) (12/29 0700) Pulse Rate:  [71-119] 78 (12/29 0600) Cardiac Rhythm:  [-] Ventricular paced (12/28 2000) Resp:  [0-25] 17 (12/29 0600) BP: (89-152)/(56-101) 119/62 mmHg (12/29 0500) SpO2:  [93 %-100 %] 99 % (12/29 0600) Arterial Line BP: (113-150)/(48-69) 148/59 mmHg (12/28 1300) Weight:  [114 kg (251 lb 5.2 oz)] 114 kg (251 lb 5.2 oz) (12/29 0420)  Hemodynamic parameters for last 24 hours: PAP: (23-34)/(10-22) 23/11 mmHg CO:  [7 L/min] 7 L/min CI:  [3 L/min/m2] 3 L/min/m2  Intake/Output from previous day: 12/28 0701 - 12/29 0700 In: 823 [P.O.:120; I.V.:553; IV Piggyback:150] Out: 1200 [Urine:1170; Chest Tube:30] Intake/Output this shift:    General appearance: alert and cooperative Neurologic: intact Heart: regular rate and rhythm, S1, S2 normal, no murmur, click, rub or gallop Lungs: clear to auscultation bilaterally Abdomen: soft, non-tender; bowel sounds normal; no masses,  no organomegaly Extremities: mild edema in legs. nontender in right groin, no mass in groin. pain is related to movement of hip joint. Wound: chest and right leg incisions ok  Lab Results:  Recent Labs  09/24/15 1700 09/24/15 1712 09/25/15 0431  WBC 11.5*  --  9.6  HGB 12.8* 12.9* 11.3*  HCT 37.7* 38.0* 34.6*  PLT 103*  --  99*   BMET:  Recent Labs  09/24/15 0400  09/24/15 1712 09/25/15 0431  NA 135  --  135 133*  K 4.2  --  4.4 4.4  CL 103  --  99* 101  CO2 26  --   --  28  GLUCOSE 130*  --  140* 118*  BUN 8  --  9 11  CREATININE 0.81  < >  0.80 0.82  CALCIUM 8.1*  --   --  8.4*  < > = values in this interval not displayed.  PT/INR:  Recent Labs  09/23/15 1315  LABPROT 17.9*  INR 1.47   ABG    Component Value Date/Time   PHART 7.368 09/23/2015 2259   HCO3 23.3 09/23/2015 2259   TCO2 28 09/24/2015 1712   ACIDBASEDEF 2.0 09/23/2015 2259   O2SAT 92.0 09/23/2015 2259   CBG (last 3)   Recent Labs  09/24/15 1925 09/24/15 2322 09/25/15 0333  GLUCAP 130* 117* 102*   CLINICAL DATA: CABG  EXAM: PORTABLE CHEST 1 VIEW  COMPARISON: 09/24/2015  FINDINGS: Interval removal of Swan-Ganz catheter. Left pacer is unchanged. Removal of prior left chest tube without pneumothorax. Mild cardiomegaly. Left base atelectasis with small left effusion.  IMPRESSION: Interval mobile of left chest tube without pneumothorax.  Left base atelectasis with small left effusion.   Electronically Signed  By: Charlett Nose M.D.  On: 09/25/2015 08:22  Assessment/Plan: S/P Procedure(s) (LRB): CORONARY ARTERY BYPASS GRAFTING (CABG), ON PUMP, TIMES FOUR, USING LEFT INTERNAL MAMMARY ARTERY, RIGHT GREATER SAPHENOUS VEIN HARVESTED ENDOSCOPICALLY (N/A) TRANSESOPHAGEAL ECHOCARDIOGRAM (TEE) (N/A)  He is hemodynamically stable, sensing atrial and pacing ventricle at 80.   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 His pain is only related to movement of the right hip joint today  and is significantly improved with Toradol. Yesterday he had some tenderness in the right groin but that is gone today. I think we can continue Toradol since creat is normal but will need to follow renal function closely. He was started on Neurontin in case this was irritation of the lateral femoral cutaneous nerve but I would not expect that to be exacerbated by hip joint motion. Will ask PT to see him for help with ambulation.   LOS: 2 days    Alleen BorneBryan K Kazuo Durnil 09/25/2015

## 2015-09-25 NOTE — Progress Notes (Signed)
      [address]       [place] [id number]             [phone]      Resting comfortably at present  BP 107/79 mmHg  Pulse 77  Temp(Src) 98.1 F (36.7 C) (Oral)  Resp 14  Ht 6\' 1"  (1.854 m)  Wt 251 lb 5.2 oz (114 kg)  BMI 33.17 kg/m2  SpO2 99%   Intake/Output Summary (Last 24 hours) at 09/25/15 1758 Last data filed at 09/25/15 1700  Gross per 24 hour  Intake    620 ml  Output   1425 ml  Net   -805 ml   CBG well controlled  Viviann SpareSteven C. Dorris FetchHendrickson, MD [facility] [phone]

## 2015-09-26 LAB — BASIC METABOLIC PANEL
ANION GAP: 6 (ref 5–15)
BUN: 8 mg/dL (ref 6–20)
CALCIUM: 8.4 mg/dL — AB (ref 8.9–10.3)
CHLORIDE: 97 mmol/L — AB (ref 101–111)
CO2: 31 mmol/L (ref 22–32)
Creatinine, Ser: 0.83 mg/dL (ref 0.61–1.24)
GFR calc non Af Amer: >60 mL/min (ref >60)
GLUCOSE: 124 mg/dL — AB (ref 65–99)
POTASSIUM: 4.2 mmol/L (ref 3.5–5.1)
Sodium: 134 mmol/L — ABNORMAL LOW (ref 135–145)

## 2015-09-26 MED ORDER — TRAMADOL HCL 50 MG PO TABS
50.0000 mg | ORAL_TABLET | ORAL | Status: DC | PRN
  Administered 2015-09-28: 100 mg via ORAL
  Filled 2015-09-26: qty 1
  Filled 2015-09-26: qty 2

## 2015-09-26 MED ORDER — SODIUM CHLORIDE 0.9 % IJ SOLN: 3.0000 mL | INTRAMUSCULAR | Status: DC | PRN

## 2015-09-26 MED ORDER — BISACODYL 5 MG PO TBEC: 10.0000 mg | DELAYED_RELEASE_TABLET | Freq: Every day | ORAL | Status: DC | PRN

## 2015-09-26 MED ORDER — POTASSIUM CHLORIDE CRYS ER 20 MEQ PO TBCR
20.0000 meq | EXTENDED_RELEASE_TABLET | Freq: Two times a day (BID) | ORAL | Status: AC
  Administered 2015-09-26 – 2015-09-27 (×4): 20 meq via ORAL
  Filled 2015-09-26 (×4): qty 1

## 2015-09-26 MED ORDER — FUROSEMIDE 40 MG PO TABS
40.0000 mg | ORAL_TABLET | Freq: Every day | ORAL | Status: AC
  Administered 2015-09-26 – 2015-09-27 (×2): 40 mg via ORAL
  Filled 2015-09-26 (×2): qty 1

## 2015-09-26 MED ORDER — SODIUM CHLORIDE 0.9 % IV SOLN: 250.0000 mL | INTRAVENOUS | Status: DC | PRN

## 2015-09-26 MED ORDER — SODIUM CHLORIDE 0.9 % IJ SOLN
3.0000 mL | Freq: Two times a day (BID) | INTRAMUSCULAR | Status: DC
  Administered 2015-09-26 – 2015-09-27 (×4): 3 mL via INTRAVENOUS

## 2015-09-26 MED ORDER — DOCUSATE SODIUM 100 MG PO CAPS
200.0000 mg | ORAL_CAPSULE | Freq: Every day | ORAL | Status: DC
  Administered 2015-09-27: 200 mg via ORAL
  Filled 2015-09-26 (×2): qty 2

## 2015-09-26 MED ORDER — ASPIRIN EC 325 MG PO TBEC
325.0000 mg | DELAYED_RELEASE_TABLET | Freq: Every day | ORAL | Status: DC
  Administered 2015-09-27: 325 mg via ORAL
  Filled 2015-09-26 (×2): qty 1

## 2015-09-26 MED ORDER — OXYCODONE HCL 5 MG PO TABS
5.0000 mg | ORAL_TABLET | ORAL | Status: DC | PRN
  Administered 2015-09-26 – 2015-09-28 (×8): 10 mg via ORAL
  Filled 2015-09-26 (×8): qty 2

## 2015-09-26 MED ORDER — PANTOPRAZOLE SODIUM 40 MG PO TBEC
40.0000 mg | DELAYED_RELEASE_TABLET | Freq: Every day | ORAL | Status: DC
  Administered 2015-09-27 – 2015-09-28 (×2): 40 mg via ORAL
  Filled 2015-09-26: qty 1
  Filled 2015-09-26: qty 2

## 2015-09-26 MED ORDER — ONDANSETRON HCL 4 MG/2ML IJ SOLN: 4.0000 mg | Freq: Four times a day (QID) | INTRAMUSCULAR | Status: DC | PRN

## 2015-09-26 MED ORDER — ONDANSETRON HCL 4 MG PO TABS: 4.0000 mg | ORAL_TABLET | Freq: Four times a day (QID) | ORAL | Status: DC | PRN

## 2015-09-26 MED ORDER — BISACODYL 10 MG RE SUPP: 10.0000 mg | Freq: Every day | RECTAL | Status: DC | PRN

## 2015-09-26 MED ORDER — METOPROLOL TARTRATE 12.5 MG HALF TABLET
12.5000 mg | ORAL_TABLET | Freq: Two times a day (BID) | ORAL | Status: DC
  Administered 2015-09-26: 12.5 mg via ORAL
  Filled 2015-09-26: qty 1

## 2015-09-26 MED ORDER — MOVING RIGHT ALONG BOOK
Freq: Once | Status: AC
  Administered 2015-09-26: 13:00:00
  Filled 2015-09-26: qty 1

## 2015-09-26 NOTE — Plan of Care (Signed)
Problem: Phase II - Intermediate Post-Op Goal: Activity Progressed Outcome: Progressing Pt ambulated 300 feet in am and pm.

## 2015-09-26 NOTE — Progress Notes (Signed)
Attempted to call report to 2W.  RN currently discharging patient.

## 2015-09-26 NOTE — Discharge Summary (Signed)
Physician Discharge Summary  Patient ID: Jose [name] MRN: [id number] DOB/AGE: 1941/07/18 74 y.o.  Admit date: 09/23/2015 Discharge date: 09/28/2015  Admission Diagnoses:  Patient Active Problem List   Diagnosis Date Noted  . Angina pectoris (HCC)   . CAD (coronary artery disease) 06/25/2015  . Atrial fibrillation (HCC) 06/10/2015  . Obstructive sleep apnea 06/10/2015  . Coronary artery disease involving native coronary artery of native heart without angina pectoris 05/02/2015  . Pacemaker 05/02/2015  . Hyperlipidemia 05/02/2015  . History of coronary artery stent placement 05/02/2015  . Sick sinus syndrome (HCC) 05/02/2015   Discharge Diagnoses:   Patient Active Problem List   Diagnosis Date Noted  . S/P CABG x 4 09/23/2015  . Angina pectoris (HCC)   . CAD (coronary artery disease) 06/25/2015  . Atrial fibrillation (HCC) 06/10/2015  . Obstructive sleep apnea 06/10/2015  . Coronary artery disease involving native coronary artery of native heart without angina pectoris 05/02/2015  . Pacemaker 05/02/2015  . Hyperlipidemia 05/02/2015  . History of coronary artery stent placement 05/02/2015  . Sick sinus syndrome (HCC) 05/02/2015   Discharged Condition: good  History of Present Illness:  Jose [name] is a 74 yo male with known history of hypertension, hypercholesterolemia, hypothyroidism, CAD S/P MI in 2002 with subsequent stent to his RCA, and sick sinus syndrome S/P PPM.  The patient had repeat catheterization in 2008 at which time his previous stent was patent.  He developed complaints of fatigue and shortness of breath with exertion over the past several months.  He denies chest pain and pressure.  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  He underwent a stress test which showed reduced EF 40-45% and severe hypokinesis of the mid apical anterior, anterolateral, inferoseptal, and apical myocardium.  Due to this cardiac  catheterization was recommended.  This was done on 09/12/2015 and showed severe 3 vessel CAD and it was felt CABG was indicated.  He was referred to [facility] and evaluated by Dr. Laneta Simmers, who was in agreement the patient would required CABG surgery.  The risks and benefits of the procedure were explained to the patient and he was agreeable to proceed.  Hospital Course:   Jose [name] presented to [facility] on 09/23/2015.  He was taken to the operating room and underwent CABG x 4 utilizing LIMA to LAD, SVG to OM, SVG to Diagonal, and SVG to PD.  He also underwent endoscopic harvest of the greater saphenous vein from the right leg.  He tolerated the procedure without difficulty and was taken to the SICU in stable condition.  The patient was extubated the evening of surgery.  During his stay in the SICU the patient was weaned off Neo as tolerated.  His chest tubes and arterial lines were removed without difficulty.  His stay was complicated by hip and groin pain.  CTA of abdomen and pelvis was obtained and ruled out a retroperitoneal hematoma.  There were no other gross abnormalities identified.  He was maintaining NSR and his PPM was functioning appropriately.  His temporary pacing wires were removed without difficulty.  His hip pain improved and he was able to ambulate with some assistance.  He was felt medically stable and was transferred to the step down unit in stable condition. He had constipation but was given lactulose with resolution. He was volume overloaded and diuresed. He is ambulating on room air. Because he was tachycardic, his Lopressor was increased to  25 mg bid. Chest tube sutures will be removed in the office on 10/03/2015. He is felt surgically stable for discharge today.  Significant Diagnostic Studies: angiography:    Mid RCA lesion, 85% stenosed. The lesion was previously treated with a stent (unknown type).  Mid Cx lesion, 80% stenosed.  LM lesion, 80% stenosed.  1st Diag lesion,  90% stenosed.  Prox LAD lesion, 60% stenosed.  Treatments: surgery:    Median Sternotomy  Extracorporeal circulation 3. Coronary artery bypass grafting x 4   Left internal mammary graft to the LAD  SVG to diagonal  SVG to OM 2  SVG to PDA 4. Endoscopic vein harvest from the right leg  Disposition: 01-Home or Self Care   Discharge Medications:   Medication List    STOP taking these medications        aspirin 81 MG chewable tablet  Replaced by:  aspirin 325 MG EC tablet     meloxicam 15 MG tablet  Commonly known as:  MOBIC     metoprolol succinate 25 MG 24 hr tablet  Commonly known as:  TOPROL-XL      TAKE these medications        aspirin 325 MG EC tablet  Take 1 tablet (325 mg total) by mouth daily.     latanoprost 0.005 % ophthalmic solution  Commonly known as:  XALATAN  Place 1 drop into both eyes at bedtime.     levothyroxine 25 MCG tablet  Commonly known as:  SYNTHROID, LEVOTHROID  Take 25 mcg by mouth daily before breakfast.     metoprolol tartrate 25 MG tablet  Commonly known as:  LOPRESSOR  Take 1 tablet (25 mg total) by mouth 2 (two) times daily.     multivitamin with minerals tablet  Take 1 tablet by mouth daily.     oxyCODONE 5 MG immediate release tablet  Commonly known as:  Oxy IR/ROXICODONE  Take 1 tablet (5 mg total) by mouth every 4 (four) hours as needed for severe pain.     simvastatin 40 MG tablet  Commonly known as:  ZOCOR  Take 40 mg by mouth daily at 6 PM.     traMADol 50 MG tablet  Commonly known as:  ULTRAM  Take 1 tablet (50 mg total) by mouth every 4 (four) hours as needed for moderate pain.       The patient has been discharged on:   1.Beta Blocker:  Yes [  x ]                              No   [   ]                              If No, reason:  2.Ace Inhibitor/ARB: Yes [   ]                                     No  [x    ]                                     If No, reason: labile BP  3.Statin:   Yes [ x   ]  No  [   ]                  If No, reason:  4.Ecasa:  Yes  [  x ]                  No   [   ]                  If No, reason:    Discharge Instructions    Amb Referral to Cardiac Rehabilitation    Complete by:  As directed   Diagnosis:  CABG             Follow-up Information    Follow up with Alleen Borne, MD On 10/29/2015.   Specialty:  Cardiothoracic Surgery   Why:  Appointment is at 9:30   Contact information:   [address] [employer] [address] [place] [address] [phone]       Follow up with  [facility] On 10/29/2015.   Why:  Please get CXR at 9:00, located on the first floor of [facility]   Contact information:   [facility]       Follow up with Julien Nordmann, MD.   Specialty:  Cardiology   Why:  office will contact you   Contact information:   [address] [place] [address] [phone]       Follow up with Danella Penton., MD.   Specialty:  Internal Medicine   Why:  Call for a follow up regarding further surveillnace of HGA1C 6 (pre diabetes)   Contact information:   [address] [facility] [address] [facility] [place] [facility] [place] [address] [phone]       Follow up with Nurse On 10/03/2015.   Why:  Appointment is with nurse only to remove chest tube sutures. Office will call with appointment time.   Contact information:   [address] [place] [address] [phone]       Signed: Ardelle Balls PA-C 09/28/2015, 9:22 AM

## 2015-09-26 NOTE — Progress Notes (Signed)
Utilization review completed.  

## 2015-09-26 NOTE — Progress Notes (Signed)
CARDIAC REHAB PHASE I   PRE:  Rate/Rhythm: 88 paced  BP:  Supine:   Sitting: 90/60  Standing:    SaO2: 96%RA  MODE:  Ambulation: 300 ft   POST:  Rate/Rhythm: 106  BP:  Supine:   Sitting: 130/80  Standing:    SaO2: 99%RA 1415-1445 1500-1535 Walked pt with rolling walker on RA and asst x 1 300 ft. PT walked with us part of way to evaluate also. Encouraged pt to not bear down on arms when walking and encouraged him to rock when standing. Re enforced sternal precautions. To recliner and then left for 15 mins so PT could see. Returned to ed with pt and wife. Ed done. I did discuss A1C of 6 and need to watch carbs along with heart healthy diet. Discussed CRP 2 and [facility]. Wrote down how to view discharge video. Wife to let RN know tomorrow if they did not have rolling walker at home. Encouraged IS.  Luetta Nuttingharlene Sativa Gelles, RN BSN  09/26/2015 3:31 PM

## 2015-09-26 NOTE — Evaluation (Signed)
Physical Therapy Evaluation Patient Details Name: Jose [name] MRN: [id number] DOB: [birth year] Today's Date: 09/26/2015   History of Present Illness   The patient is a 74 year old gentleman with a history of hypertension, hypercholesterolemia, hypothyroidism, sick sinus syndrome s/p PPM and coronary artery disease s/p MI in 2002 followed by stenting of his RCA. Pt s/p CABG x 4 on 12/28.  Clinical Impression  Pt admitted for above. Pt with painful R LE causing pt to increase bilat UE WBing, limiting amb tolerance. Pt with good home set up and support and suspect once R LE pain under  Control patient will progress well and be safe to d/c home with wife.     Follow Up Recommendations Home health PT;Supervision/Assistance - 24 hour    Equipment Recommendations  Rolling walker with 5" wheels    Recommendations for Other Services       Precautions / Restrictions Precautions Precautions: Sternal Precaution Comments: pt and spouse with good understanding Restrictions Weight Bearing Restrictions: Yes Other Position/Activity Restrictions: sternal      Mobility  Bed Mobility               General bed mobility comments: pt up in chair, discussed and PT demonstrated how to get in/out of bed with HOB elevated as pt has a motion bed at home. discussed rolling out/into bed instead of pulling self up  Transfers Overall transfer level: Needs assistance Equipment used: Rolling walker (2 wheeled) Transfers: Sit to/from Stand Sit to Stand: Mod assist         General transfer comment: used rocking method and had pt squeeze heart pillow. pt required modA for intiall clearance of bottom  Ambulation/Gait Ambulation/Gait assistance: Min guard Ambulation Distance (Feet): 50 Feet Assistive device: Rolling walker (2 wheeled) Gait Pattern/deviations: Step-through pattern;Decreased step length - right;Decreased stance time - right;Antalgic Gait velocity: decreased   General Gait Details:  v/c's to decrease UE WBing on walker  Stairs Stairs:  (discussed up with the good and down with the bad)          Wheelchair Mobility    Modified Rankin (Stroke Patients Only)       Balance Overall balance assessment: Needs assistance         Standing balance support: No upper extremity supported Standing balance-Leahy Scale: Fair Standing balance comment: static okay but needs RW for safe amb                             Pertinent Vitals/Pain Pain Assessment: 0-10 Pain Score: 5  Pain Location: R groin Pain Descriptors / Indicators: Aching Pain Intervention(s): Monitored during session    Home Living Family/patient expects to be discharged to:: Private residence Living Arrangements: Spouse/significant other Available Help at Discharge: Family;Available 24 hours/day Type of Home: House Home Access: Stairs to enter Entrance Stairs-Rails: None Entrance Stairs-Number of Steps: 1 Home Layout: One level Home Equipment: Walker - 2 wheels;Shower seat - built in      Prior Function Level of Independence: Independent               Hand Dominance   Dominant Hand: Right    Extremity/Trunk Assessment   Upper Extremity Assessment: Overall WFL for tasks assessed (not formally tested due to sternal precautiosn)           Lower Extremity Assessment: Generalized weakness;RLE deficits/detail RLE Deficits / Details: limited AROM due to R groin pain    Cervical /  Trunk Assessment:  (CABGx4)  Communication   Communication: No difficulties  Cognition Arousal/Alertness: Awake/alert Behavior During Therapy: WFL for tasks assessed/performed Overall Cognitive Status: Within Functional Limits for tasks assessed                      General Comments      Exercises        Assessment/Plan    PT Assessment Patient needs continued PT services  PT Diagnosis Difficulty walking;Generalized weakness   PT Problem List Decreased  strength;Decreased range of motion;Decreased activity tolerance;Decreased balance;Decreased mobility  PT Treatment Interventions     PT Goals (Current goals can be found in the Care Plan section) Acute Rehab PT Goals Patient Stated Goal: home PT Goal Formulation: With patient Time For Goal Achievement: 10/10/15 Potential to Achieve Goals: Good    Frequency Min 3X/week   Barriers to discharge        Co-evaluation               End of Session Equipment Utilized During Treatment: Gait belt Activity Tolerance: Patient tolerated treatment well Patient left: in chair;with call bell/phone within reach;with family/visitor present Nurse Communication: Mobility status         Time: 1610-9604 PT Time Calculation (min) (ACUTE ONLY): 21 min   Charges:   PT Evaluation $Initial PT Evaluation Tier I: 1 Procedure     PT G CodesMarcene Brawn 09/26/2015, 3:22 PM  Lewis Shock, PT, DPT Pager #: [phone] Office #: [phone]

## 2015-09-26 NOTE — Discharge Instructions (Signed)
Coronary Artery Bypass Grafting, Care After °Refer to this sheet in the next few weeks. These instructions provide you with information on caring for yourself after your procedure. Your health care provider may also give you more specific instructions. Your treatment has been planned according to current medical practices, but problems sometimes occur. Call your health care provider if you have any problems or questions after your procedure. °WHAT TO EXPECT AFTER THE PROCEDURE °Recovery from surgery will be different for everyone. Some people feel well after 3 or 4 weeks, while for others it takes longer. After your procedure, it is typical to have the following: °· Nausea and a lack of appetite.   °· Constipation. °· Weakness and fatigue.   °· Depression or irritability.   °· Pain or discomfort at your incision site. °HOME CARE INSTRUCTIONS °· Take medicines only as directed by your health care provider. Do not stop taking medicines or start any new medicines without first checking with your health care provider. °· Take your pulse as directed by your health care provider. °· Perform deep breathing as directed by your health care provider. If you were given a device called an incentive spirometer, use it to practice deep breathing several times a day. Support your chest with a pillow or your arms when you take deep breaths or cough. °· Keep incision areas clean, dry, and protected. Remove or change any bandages (dressings) only as directed by your health care provider. You may have skin adhesive strips over the incision areas. Do not take the strips off. They will fall off on their own. °· Check incision areas daily for any swelling, redness, or drainage. °· If incisions were made in your legs, do the following: °¨ Avoid crossing your legs.   °¨ Avoid sitting for long periods of time. Change positions every 30 minutes.   °¨ Elevate your legs when you are sitting. °· Wear compression stockings as directed by your  health care provider. These stockings help keep blood clots from forming in your legs. °· Take showers once your health care provider approves. Until then, only take sponge baths. Pat incisions dry. Do not rub incisions with a washcloth or towel. Do not take baths, swim, or use a hot tub until your health care provider approves. °· Eat foods that are high in fiber, such as raw fruits and vegetables, whole grains, beans, and nuts. Meats should be lean cut. Avoid canned, processed, and fried foods. °· Drink enough fluid to keep your urine clear or pale yellow. °· Weigh yourself every day. This helps identify if you are retaining fluid that may make your heart and lungs work harder. °· Rest and limit activity as directed by your health care provider. You may be instructed to: °¨ Stop any activity at once if you have chest pain, shortness of breath, irregular heartbeats, or dizziness. Get help right away if you have any of these symptoms. °¨ Move around frequently for short periods or take short walks as directed by your health care provider. Increase your activities gradually. You may need physical therapy or cardiac rehabilitation to help strengthen your muscles and build your endurance. °¨ Avoid lifting, pushing, or pulling anything heavier than 10 lb (4.5 kg) for at least 6 weeks after surgery. °· Do not drive until your health care provider approves.  °· Ask your health care provider when you may return to work. °· Ask your health care provider when you may resume sexual activity. °· Keep all follow-up visits as directed by your health care   provider. This is important. °SEEK MEDICAL CARE IF: °· You have swelling, redness, increasing pain, or drainage at the site of an incision. °· You have a fever. °· You have swelling in your ankles or legs. °· You have pain in your legs.   °· You gain 2 or more pounds (0.9 kg) a day. °· You are nauseous or vomit. °· You have diarrhea.  °SEEK IMMEDIATE MEDICAL CARE IF: °· You have  chest pain that goes to your jaw or arms. °· You have shortness of breath.   °· You have a fast or irregular heartbeat.   °· You notice a "clicking" in your breastbone (sternum) when you move.   °· You have numbness or weakness in your arms or legs. °· You feel dizzy or light-headed.   °MAKE SURE YOU: °· Understand these instructions. °· Will watch your condition. °· Will get help right away if you are not doing well or get worse. °  °This information is not intended to replace advice given to you by your health care provider. Make sure you discuss any questions you have with your health care provider. °  °Document Released: 04/02/2005 Document Revised: 10/04/2014 Document Reviewed: 02/20/2013 °Elsevier Interactive Patient Education ©2016 [employer] ° °Endoscopic Saphenous Vein Harvesting, Care After °Refer to this sheet in the next few weeks. These instructions provide you with information on caring for yourself after your procedure. Your health care provider may also give you more specific instructions. Your treatment has been planned according to current medical practices, but problems sometimes occur. Call your health care provider if you have any problems or questions after your procedure. °HOME CARE INSTRUCTIONS °Medicine °· Take whatever pain medicine your surgeon prescribes. Follow the directions carefully. Do not take over-the-counter pain medicine unless your surgeon says it is okay. Some pain medicine can cause bleeding problems for several weeks after surgery. °· Follow your surgeon's instructions about driving. You will probably not be permitted to drive after heart surgery. °· Take any medicines your surgeon prescribes. Any medicines you took before your heart surgery should be checked with your health care provider before you start taking them again. °Wound care °· If your surgeon has prescribed an elastic bandage or stocking, ask how long you should wear it. °· Check the area around your surgical  cuts (incisions) whenever your bandages (dressings) are changed. Look for any redness or swelling. °· You will need to return to have the stitches (sutures) or staples taken out. Ask your surgeon when to do that. °· Ask your surgeon when you can shower or bathe. °Activity °· Try to keep your legs raised when you are sitting. °· Do any exercises your health care providers have given you. These may include deep breathing exercises, coughing, walking, or other exercises. °SEEK MEDICAL CARE IF: °· You have any questions about your medicines. °· You have more leg pain, especially if your pain medicine stops working. °· New or growing bruises develop on your leg. °· Your leg swells, feels tight, or becomes red. °· You have numbness in your leg. °SEEK IMMEDIATE MEDICAL CARE IF: °· Your pain gets much worse. °· Blood or fluid leaks from any of the incisions. °· Your incisions become warm, swollen, or red. °· You have chest pain. °· You have trouble breathing. °· You have a fever. °· You have more pain near your leg incision. °MAKE SURE YOU: °· Understand these instructions. °· Will watch your condition. °· Will get help right away if you are not doing well or   get worse. °  °This information is not intended to replace advice given to you by your health care provider. Make sure you discuss any questions you have with your health care provider. °  °Document Released: 05/26/2011 Document Revised: 10/04/2014 Document Reviewed: 05/26/2011 °Elsevier Interactive Patient Education ©2016 [employer] ° ° °

## 2015-09-26 NOTE — Progress Notes (Addendum)
3 Days Post-Op Procedure(s) (LRB): CORONARY ARTERY BYPASS GRAFTING (CABG), ON PUMP, TIMES FOUR, USING LEFT INTERNAL MAMMARY ARTERY, RIGHT GREATER SAPHENOUS VEIN HARVESTED ENDOSCOPICALLY (N/A) TRANSESOPHAGEAL ECHOCARDIOGRAM (TEE) (N/A) Subjective:  Right hip/groin pain about gone. Walked 300 ft last night and this am.  No BM yet  Objective: Vital signs in last 24 hours: Temp:  [97.5 F (36.4 C)-98.7 F (37.1 C)] 98.7 F (37.1 C) (12/30 0745) Pulse Rate:  [69-107] 105 (12/30 0700) Cardiac Rhythm:  [-] Ventricular paced (12/29 2000) Resp:  [10-23] 16 (12/30 0700) BP: (98-137)/(49-80) 98/67 mmHg (12/30 0700) SpO2:  [96 %-100 %] 96 % (12/30 0700) Weight:  [110.9 kg (244 lb 7.8 oz)] 110.9 kg (244 lb 7.8 oz) (12/30 0600)  Hemodynamic parameters for last 24 hours:    Intake/Output from previous day: 12/29 0701 - 12/30 0700 In: 600 [P.O.:480; I.V.:120] Out: 1470 [Urine:1470] Intake/Output this shift:    General appearance: alert and cooperative Heart: regular rate and rhythm, S1, S2 normal, no murmur, click, rub or gallop Lungs: clear to auscultation bilaterally Abdomen: soft, non-tender; bowel sounds normal; no masses,  no organomegaly Extremities: extremities normal, atraumatic, no cyanosis or edema Wound: incisions ok  Lab Results:  Recent Labs  09/24/15 1700 09/24/15 1712 09/25/15 0431  WBC 11.5*  --  9.6  HGB 12.8* 12.9* 11.3*  HCT 37.7* 38.0* 34.6*  PLT 103*  --  99*   BMET:  Recent Labs  09/25/15 0431 09/26/15 0303  NA 133* 134*  K 4.4 4.2  CL 101 97*  CO2 28 31  GLUCOSE 118* 124*  BUN 11 8  CREATININE 0.82 0.83  CALCIUM 8.4* 8.4*    PT/INR:  Recent Labs  09/23/15 1315  LABPROT 17.9*  INR 1.47   ABG    Component Value Date/Time   PHART 7.368 09/23/2015 2259   HCO3 23.3 09/23/2015 2259   TCO2 28 09/24/2015 1712   ACIDBASEDEF 2.0 09/23/2015 2259   O2SAT 92.0 09/23/2015 2259   CBG (last 3)   Recent Labs  09/24/15 2322 09/25/15 0333  09/25/15 0812  GLUCAP 117* 102* 114*    Assessment/Plan: S/P Procedure(s) (LRB): CORONARY ARTERY BYPASS GRAFTING (CABG), ON PUMP, TIMES FOUR, USING LEFT INTERNAL MAMMARY ARTERY, RIGHT GREATER SAPHENOUS VEIN HARVESTED ENDOSCOPICALLY (N/A) TRANSESOPHAGEAL ECHOCARDIOGRAM (TEE) (N/A)  POD 3 He is doing well overall. Hemodynamics are stable. He has an internal pacer so will remove temporary wires. Right hip and thigh pain much better. 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 Continue toradol as needed since creat normal. He was also put on Neurontin which I think can be stopped at discharge. Mobilize Diuresis Diabetes control Plan for transfer to step-down: see transfer orders  Should be ready to go home this weekend.    LOS: 3 days    Jose [name] 09/26/2015

## 2015-09-26 NOTE — Care Management Important Message (Signed)
Important Message  Patient Details  Name: Jose [name] MRN: [id number] Date of Birth: 05-23-1941   Medicare Important Message Given:  Yes    Kyla BalzarineShealy, Zorawar Strollo Abena 09/26/2015, 3:38 PM

## 2015-09-27 MED ORDER — LACTULOSE 10 GM/15ML PO SOLN
20.0000 g | Freq: Once | ORAL | Status: AC
  Administered 2015-09-27: 20 g via ORAL
  Filled 2015-09-27: qty 30

## 2015-09-27 MED ORDER — KETOROLAC TROMETHAMINE 15 MG/ML IJ SOLN
15.0000 mg | Freq: Three times a day (TID) | INTRAMUSCULAR | Status: DC | PRN
  Administered 2015-09-27 – 2015-09-28 (×3): 15 mg via INTRAVENOUS
  Filled 2015-09-27 (×3): qty 1

## 2015-09-27 MED ORDER — METOPROLOL TARTRATE 25 MG PO TABS
25.0000 mg | ORAL_TABLET | Freq: Two times a day (BID) | ORAL | Status: DC
  Administered 2015-09-27 (×2): 25 mg via ORAL
  Filled 2015-09-27 (×3): qty 1

## 2015-09-27 NOTE — Progress Notes (Signed)
Pacing wires were removed at 1:50 pm. The ends of the wires were intact. There was no bleeding or complications. Pt was educated on bedrest and frequent vital sign monitoring. Vital signs were documented before removal and vitals remained stable after removal. Pt is in bed with call light within reach and has the dinamap doing frequent vitals. Wife is at bedside.   Berdine DanceLauren Moffitt RN, BSN

## 2015-09-27 NOTE — Progress Notes (Signed)
Physical Therapy Treatment Patient Details Name: Jose [name] MRN: [id number] DOB: 1941-06-03 Today's Date: 09/27/2015    History of Present Illness  The patient is a 74 year old gentleman with a history of hypertension, hypercholesterolemia, hypothyroidism, sick sinus syndrome s/p PPM and coronary artery disease s/p MI in 2002 followed by stenting of his RCA. Pt s/p CABG x 4 on 12/28.    PT Comments    Pt admitted with above diagnosis. Pt currently with functional limitations due to endurance deficits. Pt progressing.  PAin in right LE better today.  Pt and wife feel comfortable with going home when MD feels pt is ready.  Pt states he does not need HHPT.  Will use RW and go to Cardiac REhab phase 2 when MD allows.  Pt and wife aware of precautions with mobility.  Will continue PT while in hospital to ensure mobility.   Pt will benefit from skilled PT to increase their independence and safety with mobility to allow discharge to the venue listed below.    Follow Up Recommendations  No PT follow up;Supervision/Assistance - 24 hour (plans to f/u with cardiac rehab)     Equipment Recommendations  Rolling walker with 5" wheels    Recommendations for Other Services       Precautions / Restrictions Precautions Precautions: Sternal Precaution Comments: pt and spouse with good understanding Restrictions Weight Bearing Restrictions: Yes Other Position/Activity Restrictions: sternal    Mobility  Bed Mobility               General bed mobility comments: pt sitting on EOB on arrival.   Transfers Overall transfer level: Needs assistance Equipment used: Rolling walker (2 wheeled) Transfers: Sit to/from Stand Sit to Stand: Min guard         General transfer comment: used rocking method and had pt squeeze heart pillow.  Pt was able to stand without physical assist.    Ambulation/Gait Ambulation/Gait assistance: Supervision Ambulation Distance (Feet): 275 Feet Assistive device:  Rolling walker (2 wheeled) Gait Pattern/deviations: Step-through pattern;Decreased stride length Gait velocity: decreased   General Gait Details: Pt doing well with not pressing too much on RW.  Pt needing occasional cues to stay close to RW with ambulation.  Overall, good steady gait.     Stairs Stairs:  (discussed up with the good/down with bad, and backwards)          Wheelchair Mobility    Modified Rankin (Stroke Patients Only)       Balance Overall balance assessment: Needs assistance         Standing balance support: No upper extremity supported;During functional activity Standing balance-Leahy Scale: Fair Standing balance comment: Stood statically to urinate in urinal and wash hands without UE support.                     Cognition Arousal/Alertness: Awake/alert Behavior During Therapy: WFL for tasks assessed/performed Overall Cognitive Status: Within Functional Limits for tasks assessed                      Exercises      General Comments General comments (skin integrity, edema, etc.): Pt declined practice on steps stating "I have it."      Pertinent Vitals/Pain Pain Assessment: 0-10 Pain Score: 5  Pain Location: sternum, right groin Pain Descriptors / Indicators: Aching;Sore Pain Intervention(s): Limited activity within patient's tolerance;Monitored during session;Premedicated before session;Repositioned  HR 112-128 bpm, 93% on RA with activity  Home Living                      Prior Function            PT Goals (current goals can now be found in the care plan section) Progress towards PT goals: Progressing toward goals    Frequency  Min 3X/week    PT Plan Discharge plan needs to be updated    Co-evaluation             End of Session Equipment Utilized During Treatment: Gait belt Activity Tolerance: Patient tolerated treatment well Patient left: in bed;with call bell/phone within reach;with family/visitor  present     Time: 0957-1020 PT Time Calculation (min) (ACUTE ONLY): 23 min  Charges:  $Gait Training: 8-22 mins $Self Care/Home Management: 8-22                    G CodesTawni Millers F October 04, 2015, 10:31 AM [facility] [phone] (pager)

## 2015-09-27 NOTE — Progress Notes (Addendum)
      [address]       [place] [id number]             [phone]        4 Days Post-Op Procedure(s) (LRB): CORONARY ARTERY BYPASS GRAFTING (CABG), ON PUMP, TIMES FOUR, USING LEFT INTERNAL MAMMARY ARTERY, RIGHT GREATER SAPHENOUS VEIN HARVESTED ENDOSCOPICALLY (N/A) TRANSESOPHAGEAL ECHOCARDIOGRAM (TEE) (N/A)  Subjective: Patient with constipation  Objective: Vital signs in last 24 hours: Temp:  [97.6 F (36.4 C)-99.5 F (37.5 C)] 98.2 F (36.8 C) (12/31 0300) Pulse Rate:  [51-114] 110 (12/31 0300) Cardiac Rhythm:  [-] Atrial fibrillation (12/30 1901) Resp:  [11-21] 18 (12/31 0300) BP: (103-124)/(57-88) 103/62 mmHg (12/31 0300) SpO2:  [95 %-99 %] 97 % (12/31 0300) Weight:  [241 lb 11.2 oz (109.634 kg)] 241 lb 11.2 oz (109.634 kg) (12/31 0300)  Pre op weight 109 kg Current Weight  09/27/15 241 lb 11.2 oz (109.634 kg)      Intake/Output from previous day: 12/30 0701 - 12/31 0700 In: 480 [P.O.:480] Out: 1750 [Urine:1750]   Physical Exam:  Cardiovascular: Paced, tachy Pulmonary: Slightly diminished at bases; no rales, wheezes, or rhonchi. Abdomen: Soft, non tender, bowel sounds present. Extremities:Trace bilateral lower extremity edema. Wounds: Clean and dry.  No erythema or signs of infection.  Lab Results: CBC: Recent Labs  09/24/15 1700 09/24/15 1712 09/25/15 0431  WBC 11.5*  --  9.6  HGB 12.8* 12.9* 11.3*  HCT 37.7* 38.0* 34.6*  PLT 103*  --  99*   BMET:  Recent Labs  09/25/15 0431 09/26/15 0303  NA 133* 134*  K 4.4 4.2  CL 101 97*  CO2 28 31  GLUCOSE 118* 124*  BUN 11 8  CREATININE 0.82 0.83  CALCIUM 8.4* 8.4*    PT/INR:  Lab Results  Component Value Date   INR 1.47 09/23/2015   INR 1.09 09/18/2015   INR 1.0 09/05/2015   ABG:  INR: Will add last result for INR, ABG once components are confirmed Will add last 4 CBG results once components are confirmed  Assessment/Plan:  1. CV - Paced, tachy. On Lopressor 12.5  mg bid. Will increase Lopressor to 25 mg bid. 2.  Pulmonary - On room air. Encourage incentive spirometer 3. Volume Overload - On Lasix 40 mg daily 4.  Acute blood loss anemia - Last H and H 11.3 and 34.6 5. Thrombocytopenia-last platelets 99,000 6. Pre op HGA1C 6. 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 7. LOC constipation 8. Remove EPW-has PPM 9. Possibly home 1-2 days  ZIMMERMAN,DONIELLE MPA-C 09/27/2015,7:35 AM  HR up today Increase metoprolol and check 12 lead Poss home in am R h[p pain  Better  -  DC neurontin  patient examined and medical record reviewed,agree with above note. Kathlee Nationseter Van Trigt III 09/27/2015

## 2015-09-28 MED ORDER — ASPIRIN 325 MG PO TBEC: 325.0000 mg | DELAYED_RELEASE_TABLET | Freq: Every day | ORAL | Status: DC

## 2015-09-28 MED ORDER — METOPROLOL TARTRATE 25 MG PO TABS: 25.0000 mg | ORAL_TABLET | Freq: Two times a day (BID) | ORAL | Status: DC

## 2015-09-28 MED ORDER — TRAMADOL HCL 50 MG PO TABS: 50.0000 mg | ORAL_TABLET | ORAL | Status: DC | PRN

## 2015-09-28 MED ORDER — OXYCODONE HCL 5 MG PO TABS: 5.0000 mg | ORAL_TABLET | ORAL | Status: DC | PRN

## 2015-09-28 NOTE — Progress Notes (Signed)
Discharged to home with family office visits in place teaching done  

## 2015-09-28 NOTE — Progress Notes (Addendum)
      [address]       [place] [id number]             [phone]        5 Days Post-Op Procedure(s) (LRB): CORONARY ARTERY BYPASS GRAFTING (CABG), ON PUMP, TIMES FOUR, USING LEFT INTERNAL MAMMARY ARTERY, RIGHT GREATER SAPHENOUS VEIN HARVESTED ENDOSCOPICALLY (N/A) TRANSESOPHAGEAL ECHOCARDIOGRAM (TEE) (N/A)  Subjective: Patient had 2 bowel movements. He hopes to go home.  Objective: Vital signs in last 24 hours: Temp:  [98 F (36.7 C)-98.2 F (36.8 C)] 98 F (36.7 C) (01/01 0513) Pulse Rate:  [91-99] 99 (01/01 0513) Cardiac Rhythm:  [-] Ventricular paced (12/31 2018) Resp:  [16-17] 17 (01/01 0513) BP: (90-122)/(51-65) 122/65 mmHg (01/01 0513) SpO2:  [94 %-97 %] 95 % (01/01 0513) Weight:  [241 lb 9.6 oz (109.589 kg)] 241 lb 9.6 oz (109.589 kg) (01/01 0519)  Pre op weight 109 kg Current Weight  09/28/15 241 lb 9.6 oz (109.589 kg)      Intake/Output from previous day: 12/31 0701 - 01/01 0700 In: 1178 [P.O.:1178] Out: 675 [Urine:675]   Physical Exam:  Cardiovascular: Paced Pulmonary: Slightly diminished at bases; no rales, wheezes, or rhonchi. Abdomen: Soft, non tender, bowel sounds present. Extremities:Trace bilateral lower extremity edema. Wounds: Clean and dry.  No erythema or signs of infection.  Lab Results: CBC:No results for input(s): WBC, HGB, HCT, PLT in the last 72 hours. BMET:   Recent Labs  09/26/15 0303  NA 134*  K 4.2  CL 97*  CO2 31  GLUCOSE 124*  BUN 8  CREATININE 0.83  CALCIUM 8.4*    PT/INR:  Lab Results  Component Value Date   INR 1.47 09/23/2015   INR 1.09 09/18/2015   INR 1.0 09/05/2015   ABG:  INR: Will add last result for INR, ABG once components are confirmed Will add last 4 CBG results once components are confirmed  Assessment/Plan:  1. CV - Paced. On Lopressor 25 mg bid. 2.  Pulmonary - On room air. Encourage incentive spirometer 3. Volume Overload - On Lasix 40 mg daily 4.  Acute blood loss  anemia - Last H and H 11.3 and 34.6 5. Thrombocytopenia-last platelets 99,000 6. Pre op HGA1C 6. 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 7. Will remove chest tube sutures in the office 8. As discussed with Dr. Donata ClayVan Trigt, discharge home  ZIMMERMAN,DONIELLE MPA-C 09/28/2015,7:39 AM

## 2015-10-01 ENCOUNTER — Other Ambulatory Visit: Payer: Self-pay | Admitting: *Deleted

## 2015-10-01 DIAGNOSIS — R600 Localized edema: Secondary | ICD-10-CM

## 2015-10-01 MED ORDER — POTASSIUM CHLORIDE CRYS ER 10 MEQ PO TBCR: 20.0000 meq | EXTENDED_RELEASE_TABLET | Freq: Every day | ORAL | Status: DC

## 2015-10-01 MED ORDER — FUROSEMIDE 40 MG PO TABS: 40.0000 mg | ORAL_TABLET | Freq: Every day | ORAL | Status: DC

## 2015-10-07 ENCOUNTER — Ambulatory Visit (INDEPENDENT_AMBULATORY_CARE_PROVIDER_SITE_OTHER): Payer: Medicare Other | Admitting: Cardiovascular Disease

## 2015-10-07 ENCOUNTER — Encounter: Payer: Self-pay | Admitting: Cardiovascular Disease

## 2015-10-07 VITALS — BP 118/64 | HR 101 | Ht 73.0 in | Wt 229.8 lb

## 2015-10-07 DIAGNOSIS — R600 Localized edema: Secondary | ICD-10-CM

## 2015-10-07 DIAGNOSIS — I2510 Atherosclerotic heart disease of native coronary artery without angina pectoris: Secondary | ICD-10-CM | POA: Diagnosis not present

## 2015-10-07 DIAGNOSIS — I495 Sick sinus syndrome: Secondary | ICD-10-CM

## 2015-10-07 DIAGNOSIS — I209 Angina pectoris, unspecified: Secondary | ICD-10-CM

## 2015-10-07 DIAGNOSIS — R0602 Shortness of breath: Secondary | ICD-10-CM

## 2015-10-07 DIAGNOSIS — Z951 Presence of aortocoronary bypass graft: Secondary | ICD-10-CM

## 2015-10-07 DIAGNOSIS — I480 Paroxysmal atrial fibrillation: Secondary | ICD-10-CM

## 2015-10-07 DIAGNOSIS — R000 Tachycardia, unspecified: Secondary | ICD-10-CM

## 2015-10-07 DIAGNOSIS — E785 Hyperlipidemia, unspecified: Secondary | ICD-10-CM

## 2015-10-07 DIAGNOSIS — I25111 Atherosclerotic heart disease of native coronary artery with angina pectoris with documented spasm: Secondary | ICD-10-CM

## 2015-10-07 MED ORDER — SIMVASTATIN 40 MG PO TABS: 40.0000 mg | ORAL_TABLET | Freq: Every day | ORAL | Status: DC

## 2015-10-07 MED ORDER — TICAGRELOR 90 MG PO TABS: 90.0000 mg | ORAL_TABLET | Freq: Two times a day (BID) | ORAL | Status: DC

## 2015-10-07 MED ORDER — ASPIRIN 81 MG PO TBEC: 81.0000 mg | DELAYED_RELEASE_TABLET | Freq: Every day | ORAL | Status: AC

## 2015-10-07 MED ORDER — METOPROLOL TARTRATE 50 MG PO TABS: 50.0000 mg | ORAL_TABLET | Freq: Two times a day (BID) | ORAL | Status: DC

## 2015-10-07 NOTE — Assessment & Plan Note (Signed)
Severe three-vessel disease, left main disease on recent catheterization 1 month ago Recommended 81 mg aspirin, start brilinta 90 mg twice a day Continue other current medications

## 2015-10-07 NOTE — Patient Instructions (Addendum)
You are doing well. Please decrease the aspirin down to 81 mg daily Start brilinta 90 mg twice a day  If you need carpal tunnel surgery, Stop the brilinta 5 to 7 days before the surgery  Please call us if you have new issues that need to be addressed before your next appt.  Your physician wants you to follow-up in: 3 months.  You will receive a reminder letter in the mail two months in advance. If you don't receive a letter, please call our office to schedule the follow-up appointment.

## 2015-10-07 NOTE — Assessment & Plan Note (Signed)
Post bypass recovery details discussed with him Recommended no heavy lifting until 3 months [place] would like to go to [place]

## 2015-10-07 NOTE — Assessment & Plan Note (Signed)
Pacemaker in place, followed by Dr. Klein 

## 2015-10-07 NOTE — Assessment & Plan Note (Signed)
Heart rate elevated likely from deconditioning, recovering from bypass surgery Recommended he slowly increased metoprolol. Would start with 37.5 mg twice a day, close monitoring of his blood pressure

## 2015-10-07 NOTE — Assessment & Plan Note (Signed)
Encouraged him to stay on his simvastatin. Goal LDL less than 70 We will recheck his labs when he comes back from [place]

## 2015-10-07 NOTE — Progress Notes (Signed)
Patient ID: Jose [name], male    DOB: 13-Jul-1941, 75 y.o.   MRN: [id number]  HPI Comments: Mr. Dubey is a pleasant 75 year old gentleman with a history of coronary artery disease, stent to his RCA in 2002 at the time of an MI, notes detailing 50-60% proximal LAD disease, 75% OM 2 disease, last catheterization July 2008 at which time the RCA was reportedly patent, history of hyperlipidemia, prior smoking history from age 30 up to 45, recent episode of sick sinus syndrome, bradycardia with heart rate in the 30s, general malaise and shortness of breath, admission to [facility] for [employer] pacemaker, dual-chamber, who presents for follow-up after recent bypass surgery History of lobectomy for lesion in his lung, benign growth  Catheterization 09/12/2015 for depressed ejection fraction, wall motion abnormality, symptoms concerning for angina     Mid RCA lesion, 85% stenosed. The lesion was previously treated with a stent (unknown type).     Mid Cx lesion, 80% stenosed.     LM lesion, 80% stenosed.     1st Diag lesion, 90% stenosed.     Prox LAD lesion, 60% stenosed.  Sent for bypass surgery 09/23/2015, Dr. Garen Grams  Coronary artery bypass grafting x 4     Left internal mammary graft to the LAD     SVG to diagonal     SVG to OM 2     SVG to PDA Endoscopic vein harvest from the right leg Hospital records reviewed with him in detail  Since then he has healed well, been recovering at home with no complaints Would like to have some pain medication given chronic knee pain Reports having pain in his right groin during the period after the catheterization and during the bypass surgery, improved now Wife concerned he is over exerting himself He is hoping to stop the water pill, Lasix. Denies any leg edema, no cough, PND, orthopnea He would like to go to [place] at the beginning of February 2017 Continues to have problems with carpal tunnel syndrome bilaterally, previously seen  by orthopedic and [place]  EKG reviewed with him on today's visit, paced rhythm 101 bpm  Other past medical history Stress test showed apical perfusion defect, depressed ejection fraction Echocardiogram showed decreased ejection fraction from prior down to 40-45% with regions of wall motion abnormality No EKG on today's visit Last EKG August 2016 showing paced rhythm   he was at [facility] over the summer 2016 when he developed malaise, shortness of breath. He went to the emergency room and was found to have heart rate in the 30s. Pacemaker was placed at [facility] without complication. No ischemia workup at that time  Lab work reviewed with him showing total cholesterol 150 range, LDL 66 Glucose 110     Allergies  Allergen Reactions  . Amoxicillin Nausea Only  . Amoxicillin-Pot Clavulanate Other (See Comments)    Patient reports he took oral Augmentin after a foot surgery, and it caused severe nausea; however reports that he can take 'plain' amoxicillin; he takes amoxicillin prior to any dentist procedures, and has on hand    Current Outpatient Prescriptions on File Prior to Visit  Medication Sig Dispense Refill  . aspirin EC 325 MG EC tablet Take 1 tablet (325 mg total) by mouth daily. 30 tablet 0  . latanoprost (XALATAN) 0.005 % ophthalmic solution Place 1 drop into both eyes at bedtime.     Marland Kitchen levothyroxine (SYNTHROID, LEVOTHROID) 25 MCG tablet Take 25 mcg by mouth daily before  breakfast.     . Multiple Vitamins-Minerals (MULTIVITAMIN WITH MINERALS) tablet Take 1 tablet by mouth daily.    Marland Kitchen. oxyCODONE (OXY IR/ROXICODONE) 5 MG immediate release tablet Take 1 tablet (5 mg total) by mouth every 4 (four) hours as needed for severe pain. 30 tablet 0  . potassium chloride (K-DUR,KLOR-CON) 10 MEQ tablet Take 2 tablets (20 mEq total) by mouth daily. 60 tablet 1  . traMADol (ULTRAM) 50 MG tablet Take 1 tablet (50 mg total) by mouth every 4 (four) hours as needed for moderate pain.  20 tablet 0   No current facility-administered medications on file prior to visit.    Past Medical History  Diagnosis Date  . Hypertension   . Pacemaker   . Hypercholesteremia   . Hypothyroidism   . MI (myocardial infarction) (HCC)   . Coronary artery disease 2002    RCA stent, 2002. 50-60% stenosis of proximal LAD, 75% stenosis OM2, patent stent mid RCA by cardiac catheterization, 04/17/07  . History of lobectomy of lung 2014    right side (non cancerous)  . Headache     hx of migraines - went away in his 4140's    Past Surgical History  Procedure Laterality Date  . Hammer toe surgery    . Total knee arthroplasty      right  . Shoulder surgery Right     replacement  . Cardiac catheterization    . Coronary angioplasty with stent placement    . Lobectomy      upper right   . Insert / replace / remove pacemaker  02/25/2015    Serial # [id number] H Model # A2DR01/ DDD  . Cardiac catheterization Left 09/12/2015    Procedure: Left Heart Cath and Coronary Angiography;  Surgeon: Antonieta Ibaimothy J Zamiya Dillard, MD;  Location: [facility] INVASIVE CV LAB;  Service: Cardiovascular;  Laterality: Left;  . Coronary artery bypass graft N/A 09/23/2015    Procedure: CORONARY ARTERY BYPASS GRAFTING (CABG), ON PUMP, TIMES FOUR, USING LEFT INTERNAL MAMMARY ARTERY, RIGHT GREATER SAPHENOUS VEIN HARVESTED ENDOSCOPICALLY;  Surgeon: Alleen BorneBryan K Bartle, MD;  Location: [facility] OR;  Service: Open Heart Surgery;  Laterality: N/A;  . Tee without cardioversion N/A 09/23/2015    Procedure: TRANSESOPHAGEAL ECHOCARDIOGRAM (TEE);  Surgeon: Alleen BorneBryan K Bartle, MD;  [facility];  Service: Open Heart Surgery;  Laterality: N/A;    Social History  reports that he quit smoking about 24 years ago. His smoking use included Cigarettes. He has a 25 pack-year smoking history. He has never used smokeless tobacco. He reports that he drinks about 8.4 oz of alcohol per week. He reports that he does not use illicit drugs.  Family History family history  includes Heart attack (age of onset: 5848) in his mother.  Review of Systems  Constitutional: Negative.   Respiratory: Negative.   Cardiovascular: Negative.   Gastrointestinal: Negative.   Musculoskeletal: Negative.   Neurological: Negative.   Hematological: Negative.   Psychiatric/Behavioral: Negative.   All other systems reviewed and are negative.   BP 118/64 mmHg  Pulse 101  Ht 6\' 1"  (1.854 m)  Wt 229 lb 12 oz (104.214 kg)  BMI 30.32 kg/m2  Physical Exam  Constitutional: He is oriented to person, place, and time. He appears well-developed and well-nourished.  HENT:  Head: Normocephalic.  Nose: Nose normal.  Mouth/Throat: Oropharynx is clear and moist.  Eyes: Conjunctivae are normal. Pupils are equal, round, and reactive to light.  Neck: Normal range of motion. Neck supple. No JVD present.  Cardiovascular:  Normal rate, regular rhythm, normal heart sounds and intact distal pulses.  Exam reveals no gallop and no friction rub.   No murmur heard. Well healed mediastinal scar  Pulmonary/Chest: Effort normal and breath sounds normal. No respiratory distress. He has no wheezes. He has no rales. He exhibits no tenderness.  Abdominal: Soft. Bowel sounds are normal. He exhibits no distension. There is no tenderness.  Musculoskeletal: Normal range of motion. He exhibits no edema or tenderness.  Lymphadenopathy:    He has no cervical adenopathy.  Neurological: He is alert and oriented to person, place, and time. Coordination normal.  Skin: Skin is warm and dry. No rash noted. No erythema.  Psychiatric: He has a normal mood and affect. His behavior is normal. Judgment and thought content normal.  Vitals reviewed.

## 2015-10-07 NOTE — Assessment & Plan Note (Signed)
Maintaining normal sinus rhythm on today's visit. Can monitor arrhythmia through pacer download

## 2015-10-07 NOTE — Assessment & Plan Note (Signed)
Currently without chest pain symptoms Medication changes as above

## 2015-10-20 ENCOUNTER — Telehealth: Payer: Self-pay | Admitting: *Deleted

## 2015-10-20 NOTE — Telephone Encounter (Signed)
Spoke w/ pt's wife.  She reports that has been having trouble sleeping. Reports that pt's HR has been WNL when checked, but feels that it is "beating hard".  At ov 10/14/15, pt's metoprolol was increased:  "Heart rate elevated likely from deconditioning, recovering from bypass surgery Recommended he slowly increased metoprolol. Would start with 37.5 mg twice a day, close monitoring of his blood pressure "  She reports that pt has been taking metoprolol 50 mg twice daily.  Brilinta 90 mg BID was started at that time, as well.  Pt's wife believes that med changes are causing pt's sx. Discussed w/ her the purpose of each med, but she would like Dr. Windell Hummingbird input.  Advised her that he is in clinic today and will not be able to look at messages until possibly this evening. Advised her to call Dr. Garen Grams' office in the meantime to make him aware, as well.   She is agreeable and appreciative of the call.

## 2015-10-20 NOTE — Telephone Encounter (Signed)
Unclear if the medications would be causing his sx, But could try to cut the metoprolol back to 1/2 pill at dinner, Whole in the AM  Monitor BP and heart rate

## 2015-10-20 NOTE — Telephone Encounter (Signed)
Pt c/o medication issue:  1. Name of Medication: Metoprolol and may be BRILINTA  2. How are you currently taking this medication (dosage and times per day)? Taking 1 50 mg twice a day 1 in the am and 1 pm  3. Are you having a reaction (difficulty breathing--STAT)? Not really  4. What is your medication issue? He is having some issues with sleeping. He stated to pt wife stating that every time every time his heart beat if feels like it is stirring his whole body. And only sleeping 30 at a time. Not sure if it because we changed the medication.  Please advise.

## 2015-10-21 NOTE — Telephone Encounter (Signed)
Spoke w/ pt.  Advised him of Dr. Windell Hummingbird recommendation.  He is agreeable and will monitor his HR & BP, will bring readings to appt w/ Dr. Laneta Simmers next week.  Pt reports that he has been feeling nauseous every afternoon, unsure if this sx is r/t his meds. He will call back if his sx worsen or continue

## 2015-10-28 ENCOUNTER — Other Ambulatory Visit: Payer: Self-pay | Admitting: Surgery

## 2015-10-28 DIAGNOSIS — Z951 Presence of aortocoronary bypass graft: Secondary | ICD-10-CM

## 2015-10-29 ENCOUNTER — Ambulatory Visit (INDEPENDENT_AMBULATORY_CARE_PROVIDER_SITE_OTHER): Payer: Self-pay | Admitting: Surgery

## 2015-10-29 ENCOUNTER — Ambulatory Visit (INDEPENDENT_AMBULATORY_CARE_PROVIDER_SITE_OTHER): Payer: Medicare Other | Admitting: *Deleted

## 2015-10-29 ENCOUNTER — Ambulatory Visit
Admission: RE | Admit: 2015-10-29 | Discharge: 2015-10-29 | Disposition: A | Discharge status: Home / Self Care | Payer: Medicare Other | Source: Ambulatory Visit | Attending: Surgery | Admitting: Surgery

## 2015-10-29 ENCOUNTER — Encounter: Payer: Self-pay | Admitting: Surgery

## 2015-10-29 VITALS — BP 118/76 | HR 90 | Resp 16 | Ht 73.0 in | Wt 229.0 lb

## 2015-10-29 DIAGNOSIS — Z951 Presence of aortocoronary bypass graft: Secondary | ICD-10-CM

## 2015-10-29 DIAGNOSIS — I495 Sick sinus syndrome: Secondary | ICD-10-CM | POA: Diagnosis not present

## 2015-10-29 DIAGNOSIS — I2510 Atherosclerotic heart disease of native coronary artery without angina pectoris: Secondary | ICD-10-CM

## 2015-10-29 NOTE — Progress Notes (Signed)
Remote pacemaker transmission.   

## 2015-10-29 NOTE — Progress Notes (Signed)
      HPI: Patient returns for routine postoperative follow-up having undergone CABG x 4  on 09/23/2015. The patient's early postoperative recovery while in the hospital was notable for an uncomplicated postop course. Since hospital discharge the patient reports that he has been steadily improving. He does report some nausea developing in the afternoons that he thinks may be related to medication. He is walking without chest pain or shortness of breath. He would like to go to [place] next week for about 8 weeks.   Current Outpatient Prescriptions  Medication Sig Dispense Refill  . aspirin 81 MG EC tablet Take 1 tablet (81 mg total) by mouth daily.    Marland Kitchen latanoprost (XALATAN) 0.005 % ophthalmic solution Place 1 drop into both eyes at bedtime.     Marland Kitchen levothyroxine (SYNTHROID, LEVOTHROID) 25 MCG tablet Take 25 mcg by mouth daily before breakfast.     . metoprolol tartrate (LOPRESSOR) 50 MG tablet Take 1 tablet (50 mg total) by mouth 2 (two) times daily. 180 tablet 3  . Multiple Vitamins-Minerals (MULTIVITAMIN WITH MINERALS) tablet Take 1 tablet by mouth daily.    . simvastatin (ZOCOR) 40 MG tablet Take 1 tablet (40 mg total) by mouth daily at 6 PM. 90 tablet 3  . ticagrelor (BRILINTA) 90 MG TABS tablet Take 1 tablet (90 mg total) by mouth 2 (two) times daily. 180 tablet 3   No current facility-administered medications for this visit.    Physical Exam: BP 118/76 mmHg  Pulse 90  Resp 16  Ht  (1.854 m)  Wt 229 lb (103.874 kg)  BMI 30.22 kg/m2  SpO2 98% He looks well. Lung exam is clear. Cardiac exam shows a regular rate and rhythm with normal heart sounds. Chest incision is healing well and sternum is stable. The leg incisions are healing well and there is no peripheral edema.    Diagnostic Tests:  CLINICAL DATA: CABG.  EXAM: CHEST 2 VIEW  COMPARISON: 09/25/2015.  FINDINGS: Cardiac pacer with lead tips in right atrium and right ventricle. Prior CABG. Cardiomegaly  with normal pulmonary vascularity. Prominent epicardial fat pad. Low lung volumes with mild bibasilar atelectasis. No pleural effusion or pneumothorax. Biapical pleural parenchymal thickening noted consistent with scarring . No acute bony abnormality. Right shoulder replacement.  IMPRESSION: 1. Cardiac pacer noted with lead tips in right atrium and right ventricle. Prior CABG. Stable mild cardiomegaly. 2. Low lung volumes with mild bibasilar atelectasis.   Electronically Signed  By: Jose [name] Register  On: 10/29/2015 09:34       Impression:  Overall I think he is doing well. I encouraged him to continue walking. He was planning to participate in cardiac rehab but would like to wait until he returns from [place]. I told him he could drive his car but should not lift anything heavier than 10 lbs for three months postop.    Plan:  He will continue follow up with Dr. Mariah [name] and Dr. Hyacinth [name] when he returns from [place].   Jose Borne, MD [facility] [phone]

## 2015-11-14 ENCOUNTER — Encounter: Payer: Self-pay | Admitting: Cardiology

## 2015-11-14 LAB — CUP PACEART REMOTE DEVICE CHECK
Battery Remaining Longevity: 117 mo
Brady Statistic AP VP Percent: 14.36 %
Brady Statistic AP VS Percent: 0.05 %
Brady Statistic AS VS Percent: 1.44 %
Implantable Lead Implant Date: 20160531
Implantable Lead Location: 753859
Implantable Lead Model: 5076
Lead Channel Impedance Value: 380 Ohm
Lead Channel Impedance Value: 399 Ohm
Lead Channel Pacing Threshold Amplitude: 0.5 V
Lead Channel Sensing Intrinsic Amplitude: 0.75 mV
Lead Channel Sensing Intrinsic Amplitude: 0.75 mV
Lead Channel Sensing Intrinsic Amplitude: 10.75 mV
Lead Channel Setting Pacing Amplitude: 2 V
MDC IDC LEAD IMPLANT DT: 20160531
MDC IDC LEAD LOCATION: 753860
MDC IDC MSMT BATTERY VOLTAGE: 3.03 V
MDC IDC MSMT LEADCHNL RA IMPEDANCE VALUE: 285 Ohm
MDC IDC MSMT LEADCHNL RA PACING THRESHOLD AMPLITUDE: 1.5 V
MDC IDC MSMT LEADCHNL RA PACING THRESHOLD PULSEWIDTH: 0.4 ms
MDC IDC MSMT LEADCHNL RV IMPEDANCE VALUE: 437 Ohm
MDC IDC MSMT LEADCHNL RV PACING THRESHOLD PULSEWIDTH: 0.4 ms
MDC IDC MSMT LEADCHNL RV SENSING INTR AMPL: 10.75 mV
MDC IDC SESS DTM: 20170201163633
MDC IDC SET LEADCHNL RA PACING AMPLITUDE: 3.25 V
MDC IDC SET LEADCHNL RV PACING PULSEWIDTH: 0.4 ms
MDC IDC SET LEADCHNL RV SENSING SENSITIVITY: 1.2 mV
MDC IDC STAT BRADY AS VP PERCENT: 84.15 %
MDC IDC STAT BRADY RA PERCENT PACED: 14.4 %
MDC IDC STAT BRADY RV PERCENT PACED: 98.51 %

## 2016-01-28 ENCOUNTER — Ambulatory Visit (INDEPENDENT_AMBULATORY_CARE_PROVIDER_SITE_OTHER): Payer: Medicare Other | Admitting: *Deleted

## 2016-01-28 DIAGNOSIS — I495 Sick sinus syndrome: Secondary | ICD-10-CM

## 2016-01-29 NOTE — Progress Notes (Signed)
Remote pacemaker transmission.   

## 2016-03-05 ENCOUNTER — Encounter: Payer: Self-pay | Admitting: Cardiology

## 2016-03-08 LAB — CUP PACEART REMOTE DEVICE CHECK
Battery Remaining Longevity: 101 mo
Battery Voltage: 3.02 V
Brady Statistic AP VP Percent: 30.52 %
Brady Statistic AP VS Percent: 0.05 %
Brady Statistic AS VP Percent: 68.88 %
Brady Statistic AS VS Percent: 0.56 %
Brady Statistic RA Percent Paced: 30.56 %
Brady Statistic RV Percent Paced: 99.39 %
Date Time Interrogation Session: 20170503160938
Implantable Lead Implant Date: 20160531
Implantable Lead Implant Date: 20160531
Implantable Lead Location: 753859
Implantable Lead Location: 753860
Implantable Lead Model: 5076
Implantable Lead Model: 5076
Lead Channel Impedance Value: 266 Ohm
Lead Channel Impedance Value: 380 Ohm
Lead Channel Impedance Value: 399 Ohm
Lead Channel Impedance Value: 456 Ohm
Lead Channel Pacing Threshold Amplitude: 0.625 V
Lead Channel Pacing Threshold Amplitude: 1.25 V
Lead Channel Pacing Threshold Pulse Width: 0.4 ms
Lead Channel Pacing Threshold Pulse Width: 0.4 ms
Lead Channel Sensing Intrinsic Amplitude: 0.75 mV
Lead Channel Sensing Intrinsic Amplitude: 0.75 mV
Lead Channel Sensing Intrinsic Amplitude: 17.875 mV
Lead Channel Sensing Intrinsic Amplitude: 17.875 mV
Lead Channel Setting Pacing Amplitude: 2 V
Lead Channel Setting Pacing Amplitude: 2.5 V
Lead Channel Setting Pacing Pulse Width: 0.4 ms
Lead Channel Setting Sensing Sensitivity: 1.2 mV

## 2016-03-19 ENCOUNTER — Encounter: Payer: Self-pay | Admitting: Cardiology

## 2016-05-20 ENCOUNTER — Ambulatory Visit: Payer: Medicare Other | Admitting: Cardiovascular Disease

## 2016-05-27 ENCOUNTER — Encounter: Payer: Self-pay | Admitting: Cardiovascular Disease

## 2016-05-27 ENCOUNTER — Ambulatory Visit (INDEPENDENT_AMBULATORY_CARE_PROVIDER_SITE_OTHER): Payer: Medicare Other | Admitting: Cardiovascular Disease

## 2016-05-27 VITALS — BP 110/64 | HR 67 | Ht 73.0 in | Wt 234.2 lb

## 2016-05-27 DIAGNOSIS — I495 Sick sinus syndrome: Secondary | ICD-10-CM | POA: Diagnosis not present

## 2016-05-27 DIAGNOSIS — E785 Hyperlipidemia, unspecified: Secondary | ICD-10-CM

## 2016-05-27 DIAGNOSIS — I2510 Atherosclerotic heart disease of native coronary artery without angina pectoris: Secondary | ICD-10-CM | POA: Diagnosis not present

## 2016-05-27 DIAGNOSIS — I209 Angina pectoris, unspecified: Secondary | ICD-10-CM

## 2016-05-27 DIAGNOSIS — Z951 Presence of aortocoronary bypass graft: Secondary | ICD-10-CM

## 2016-05-27 DIAGNOSIS — I480 Paroxysmal atrial fibrillation: Secondary | ICD-10-CM | POA: Diagnosis not present

## 2016-05-27 DIAGNOSIS — I25111 Atherosclerotic heart disease of native coronary artery with angina pectoris with documented spasm: Secondary | ICD-10-CM

## 2016-05-27 NOTE — Progress Notes (Signed)
Cardiology Office Note  Date:  05/27/2016   ID:  Jose [name], DOB June 28, 1941, MRN [id number]  PCP:  Danella PentonMark F Miller, MD   Chief Complaint  Patient presents with  . Other    Cardiac clearance for rt hip replacement Dr. Ernest PineHooten. Pt would like to discuss Brilinta. Meds reviewed verbally with pt.    HPI:  Jose [name] is a pleasant 75 year old gentleman with a history of coronary artery disease,CABG, sinus syndrome, bradycardia with heart rate in the 30s, general malaise and shortness of breath, [facility] Med[employer] pacemaker, dual-chamber, prior smoking history from age 75 up to 50,Who presents for routine follow-up of his bypass surgery History of lobectomy for lesion in his lung, benign growth  In general he reports that he is doing well from a cardiovascular perspective Reports he has had right knee surgery, right shoulder surgery, getting ready for right hip surgery Recovered well from his previous surgeries with no significant complications  Right hip surgery scheduled for Sept 18th with Dr. Ernest PineHooten  Reports is brilinta is expensive, but then reports he is unsure of the price Wonders if there is a generic Reports having some numbness, tingling in his feet  Lab work reviewed with him Total chol 132, LDL 59  EKG on today's visit shows paced rhythm, 67 bpm wide QRS  Other past medical history reviewed  stent to his RCA in 2002 at the time of an MI, notes detailing 50-60% proximal LAD disease, 75% OM 2 disease,  catheterization July 2008 at which time the RCA was reportedly patent  Catheterization 09/12/2015 for depressed ejection fraction, wall motion abnormality, symptoms concerning for angina                      Mid RCA lesion, 85% stenosed. The lesion was previously treated with a stent (unknown type).                      Mid Cx lesion, 80% stenosed.                      LM lesion, 80% stenosed.                      1st Diag lesion, 90% stenosed.                       Prox LAD lesion, 60% stenosed.  Sent for bypass surgery 09/23/2015, Dr. Garen GramsBartles  Coronary artery bypass grafting x 4                      Left internal mammary graft to the LAD                      SVG to diagonal                      SVG to OM 2                      SVG to PDA Endoscopic vein harvest from the right leg Hospital records reviewed with him in detail  Previous Echocardiogram showed decreased ejection fraction from prior down to 40-45% with regions of wall motion abnormality   he was [facility] over the [facility] 2016 when he developed malaise, shortness of breath. He went to the emergency room and was  found to have heart rate in the 30s. Pacemaker was placed at [facility] without complication. No ischemia workup at that time    PMH:   has a past medical history of Coronary artery disease (2002); Headache; History of lobectomy of lung (2014); Hypercholesteremia; Hypertension; Hypothyroidism; MI (myocardial infarction) (HCC); and Pacemaker.  PSH:    Past Surgical History:  Procedure Laterality Date  . CARDIAC CATHETERIZATION    . CARDIAC CATHETERIZATION Left 09/12/2015   Procedure: Left Heart Cath and Coronary Angiography;  Surgeon: Antonieta Iba, MD;  Location: [facility] INVASIVE CV LAB;  Service: Cardiovascular;  Laterality: Left;  . CARPAL TUNNEL RELEASE Bilateral   . CORONARY ANGIOPLASTY WITH STENT PLACEMENT    . CORONARY ARTERY BYPASS GRAFT N/A 09/23/2015   Procedure: CORONARY ARTERY BYPASS GRAFTING (CABG), ON PUMP, TIMES FOUR, USING LEFT INTERNAL MAMMARY ARTERY, RIGHT GREATER SAPHENOUS VEIN HARVESTED ENDOSCOPICALLY;  Surgeon: Alleen Borne, MD;  Location: [facility] OR;  Service: Open Heart Surgery;  Laterality: N/A;  . HAMMER TOE SURGERY    . INSERT / REPLACE / REMOVE PACEMAKER  02/25/2015   Serial # [id number] H Model # A2DR01/ DDD  . LOBECTOMY     upper right   . SHOULDER SURGERY Right    replacement  . TEE WITHOUT CARDIOVERSION N/A  09/23/2015   Procedure: TRANSESOPHAGEAL ECHOCARDIOGRAM (TEE);  Surgeon: Alleen Borne, MD;  [facility];  Service: Open Heart Surgery;  Laterality: N/A;  . TOTAL KNEE ARTHROPLASTY     right    Current Outpatient Prescriptions  Medication Sig Dispense Refill  . aspirin 81 MG EC tablet Take 1 tablet (81 mg total) by mouth daily.    Marland Kitchen latanoprost (XALATAN) 0.005 % ophthalmic solution Place 1 drop into both eyes at bedtime.     Marland Kitchen levothyroxine (SYNTHROID, LEVOTHROID) 25 MCG tablet Take 25 mcg by mouth daily before breakfast.     . metoprolol tartrate (LOPRESSOR) 50 MG tablet Take 1 tablet (50 mg total) by mouth 2 (two) times daily. 180 tablet 3  . Multiple Vitamins-Minerals (MULTIVITAMIN WITH MINERALS) tablet Take 1 tablet by mouth daily.    . simvastatin (ZOCOR) 40 MG tablet Take 1 tablet (40 mg total) by mouth daily at 6 PM. 90 tablet 3  . ticagrelor (BRILINTA) 90 MG TABS tablet Take 1 tablet (90 mg total) by mouth 2 (two) times daily. 180 tablet 3   No current facility-administered medications for this visit.      Allergies:   Amoxicillin and Amoxicillin-pot clavulanate   Social History:  The patient  reports that he quit smoking about 25 years ago. His smoking use included Cigarettes. He has a 25.00 pack-year smoking history. He has never used smokeless tobacco. He reports that he drinks about 8.4 oz of alcohol per week . He reports that he does not use drugs.   Family History:   family history includes Heart attack (age of onset: 35) in his mother.    Review of Systems: Review of Systems  Constitutional: Negative.   Respiratory: Negative.   Cardiovascular: Negative.   Gastrointestinal: Negative.   Musculoskeletal: Positive for joint pain.  Neurological: Negative.   Psychiatric/Behavioral: Negative.   All other systems reviewed and are negative.    PHYSICAL EXAM: VS:  BP 110/64 (BP Location: Left Arm, Patient Position: Sitting, Cuff Size: Normal)   Pulse 67   Ht 6\' 1"   (1.854 m)   Wt 234 lb 4 oz (106.3 kg)   BMI 30.91 kg/m  , BMI Body mass  index is 30.91 kg/m. GEN: Well nourished, well developed, in no acute distress  HEENT: normal  Neck: no JVD, carotid bruits, or masses Cardiac: RRR; no murmurs, rubs, or gallops,no edema  Respiratory:  clear to auscultation bilaterally, normal work of breathing GI: soft, nontender, nondistended, + BS MS: no deformity or atrophy  Skin: warm and dry, no rash Neuro:  Strength and sensation are intact Psych: euthymic mood, full affect    Recent Labs: 09/18/2015: ALT 32 09/24/2015: Magnesium 2.2 09/25/2015: Hemoglobin 11.3; Platelets 99 09/26/2015: BUN 8; Creatinine, Ser 0.83; Potassium 4.2; Sodium 134    Lipid Panel No results found for: CHOL, HDL, LDLCALC, TRIG    Wt Readings from Last 3 Encounters:  05/27/16 234 lb 4 oz (106.3 kg)  10/29/15 229 lb (103.9 kg)  10/07/15 229 lb 12 oz (104.2 kg)       ASSESSMENT AND PLAN:  Paroxysmal atrial fibrillation (HCC) - Plan: EKG 12-Lead Maintaining normal sinus rhythm, no changes to his medications  Hyperlipidemia Cholesterol is at goal on the current lipid regimen. No changes to the medications were made.  Coronary artery disease involving native coronary artery of native heart without angina pectoris Denies having any symptoms concerning for angina. No further testing at this time  Sick sinus syndrome [facility]) Status post pacemaker followed by Dr. Graciela Husbands  Coronary artery disease involving native coronary artery of native heart with angina pectoris with documented spasm (HCC) As above, no anginal symptoms. No further testing  S/P CABG x 4 Recovered well from his bypass surgery December 2016. Long discussion concerning anticoagulation. He will check the price of his brilinta and consider changing to Plavix if the former is expensive  In preparation for surgery on his hip September 18, he will need to stop brilinta (or plavix) 5 days before the procedure.  Recommended he stay on low-dose aspirin through the procedure.  No further testing is needed prior to the surgery, acceptable risk   Total encounter time more than 25 minutes  Greater than 50% was spent in counseling and coordination of care with the patient   Disposition:   F/U  6 months   Orders Placed This Encounter  Procedures  . EKG 12-Lead     Signed, Dossie Arbour, M.D., Ph.D. 05/27/2016  [facility] [place], [place] [phone]

## 2016-05-27 NOTE — Patient Instructions (Addendum)
Medication Instructions:   No medication changes made  Check the price of the brilinta If it is expensive, we will change to plavix  Stop brilinta/plavix for 5 days prior to hip surgery  Labwork:  No new labs needed  Testing/Procedures:  No further testing at this time   Follow-Up: It was a pleasure seeing you in the office today. Please call us if you have new issues that need to be addressed before your next appt.  [phone]  Your physician wants you to follow-up in: 12 months.  You will receive a reminder letter in the mail two months in advance. If you don't receive a letter, please call our office to schedule the follow-up appointment.  If you need a refill on your cardiac medications before your next appointment, please call your pharmacy.

## 2016-06-02 ENCOUNTER — Telehealth: Payer: Self-pay | Admitting: Internal Medicine

## 2016-06-02 NOTE — Telephone Encounter (Signed)
Left message for pt that Dr. Mariah MillingGollan cleared him to proceed w/ surgery at his ov on 05/27/16, provided him w/ instructions on his Brilinta & aspirin, and advised that no further testing is needed. Asked him to call back w/ any questions or concerns.

## 2016-06-02 NOTE — Telephone Encounter (Signed)
Patient wants to know if he needs to see Dr. Graciela HusbandsKlein for surgical clearance.  Dr. Graciela HusbandsKlein appt is scheduled for 9/19 and his surgery for hip is 9/18.  Please call to discus .  Patient just saw Dr. Mariah MillingGollan 8/31.

## 2016-06-03 ENCOUNTER — Ambulatory Visit: Payer: Medicare Other | Admitting: Internal Medicine

## 2016-06-03 ENCOUNTER — Encounter
Admission: RE | Admit: 2016-06-03 | Discharge: 2016-06-03 | Disposition: A | Discharge status: Home / Self Care | Payer: Medicare Other | Source: Ambulatory Visit | Attending: Orthopedic Surgery | Admitting: Orthopedic Surgery

## 2016-06-03 DIAGNOSIS — Z01818 Encounter for other preprocedural examination: Secondary | ICD-10-CM | POA: Diagnosis present

## 2016-06-03 LAB — CBC
HEMATOCRIT: 43.8 % (ref 40.0–52.0)
HEMOGLOBIN: 15.4 g/dL (ref 13.0–18.0)
MCH: 33.9 pg (ref 26.0–34.0)
MCHC: 35.2 g/dL (ref 32.0–36.0)
MCV: 96.3 fL (ref 80.0–100.0)
Platelets: 163 10*3/uL (ref 150–440)
RBC: 4.55 MIL/uL (ref 4.40–5.90)
RDW: 14 % (ref 11.5–14.5)
WBC: 5.2 10*3/uL (ref 3.8–10.6)

## 2016-06-03 LAB — COMPREHENSIVE METABOLIC PANEL
ALK PHOS: 54 U/L (ref 38–126)
ALT: 29 U/L (ref 17–63)
AST: 27 U/L (ref 15–41)
Albumin: 4.2 g/dL (ref 3.5–5.0)
Anion gap: 4 — ABNORMAL LOW (ref 5–15)
BILIRUBIN TOTAL: 0.6 mg/dL (ref 0.3–1.2)
BUN: 15 mg/dL (ref 6–20)
CALCIUM: 9.5 mg/dL (ref 8.9–10.3)
CO2: 32 mmol/L (ref 22–32)
CREATININE: 0.81 mg/dL (ref 0.61–1.24)
Chloride: 102 mmol/L (ref 101–111)
GFR calc non Af Amer: >60 mL/min (ref >60)
Glucose, Bld: 95 mg/dL (ref 65–99)
Potassium: 4.7 mmol/L (ref 3.5–5.1)
SODIUM: 138 mmol/L (ref 135–145)
Total Protein: 7.3 g/dL (ref 6.5–8.1)

## 2016-06-03 LAB — URINALYSIS COMPLETE WITH MICROSCOPIC (ARMC ONLY)
BILIRUBIN URINE: NEGATIVE
Bacteria, UA: NONE SEEN
GLUCOSE, UA: NEGATIVE mg/dL
Ketones, ur: NEGATIVE mg/dL
LEUKOCYTES UA: NEGATIVE
Nitrite: NEGATIVE
Protein, ur: NEGATIVE mg/dL
SPECIFIC GRAVITY, URINE: 1.021 (ref 1.005–1.030)
SQUAMOUS EPITHELIAL / LPF: NONE SEEN
pH: 5 (ref 5.0–8.0)

## 2016-06-03 LAB — TYPE AND SCREEN
ABO/RH(D): B POS
Antibody Screen: NEGATIVE

## 2016-06-03 LAB — PROTIME-INR
INR: 1.03
Prothrombin Time: 13.5 seconds (ref 11.4–15.2)

## 2016-06-03 LAB — SEDIMENTATION RATE: Sed Rate: 6 mm/hr (ref 0–20)

## 2016-06-03 LAB — APTT: aPTT: 30 seconds (ref 24–36)

## 2016-06-03 LAB — SURGICAL PCR SCREEN
MRSA, PCR: NEGATIVE
STAPHYLOCOCCUS AUREUS: NEGATIVE

## 2016-06-03 NOTE — Patient Instructions (Addendum)
  Your procedure is scheduled on: 06/14/16 Report to Day Surgery. To find out your arrival time please call [phone] between 1PM - 3PM on Friday 06/11/16.  Remember: Instructions that are not followed completely may result in serious medical risk, up to and including death, or upon the discretion of your surgeon and anesthesiologist your surgery may need to be rescheduled.    __x__ 1. Do not eat food or drink liquids after midnight. No gum chewing or hard candies.     __x__ 2. No Alcohol for 24 hours before or after surgery.   ____ 3. Do Not Smoke For 24 Hours Prior to Your Surgery.   ____ 4. Bring all medications with you on the day of surgery if instructed.    __x__ 5. Notify your doctor if there is any change in your medical condition     (cold, fever, infections).       Do not wear jewelry, make-up, hairpins, clips or nail polish.  Do not wear lotions, powders, or perfumes. You may wear deodorant.  Do not shave 48 hours prior to surgery. Men may shave face and neck.  Do not bring valuables to the hospital.    [facility] for any belongings or valuables.               Contacts, dentures or bridgework may not be worn into surgery.  Leave your suitcase in the car. After surgery it may be brought to your room.  For patients admitted to the hospital, discharge time is determined by your                treatment team.   Patients discharged the day of surgery will not be allowed to drive home.   Please read over the following fact sheets that you were given:   MRSA Information and Surgical Site Infection Prevention   ____ Take these medicines the morning of surgery with A SIP OF WATER:    1. levothyroxine (SYNTHROID, LEVOTHROID) 25 MCG tablet  2. metoprolol tartrate (LOPRESSOR) 50 MG tablet  3.   4.  5.  6.  ____ Fleet Enema (as directed)   __x__ Use CHG Soap as directed  ____ Use inhalers on the day of surgery  ____ Stop metformin 2 days prior to  surgery    ____ Take 1/2 of usual insulin dose the night before surgery and none on the morning of surgery.   _x___ Stop Brilinta on 06/07/16  _x___ Stop Anti-inflammatories on tylenol for pain   ____ Stop supplements until after surgery.    ____ Bring C-Pap to the hospital.

## 2016-06-04 LAB — URINE CULTURE
CULTURE: NO GROWTH
Special Requests: NORMAL

## 2016-06-14 ENCOUNTER — Inpatient Hospital Stay: Payer: Medicare Other

## 2016-06-14 ENCOUNTER — Inpatient Hospital Stay: Payer: Medicare Other | Admitting: Certified Registered"

## 2016-06-14 ENCOUNTER — Inpatient Hospital Stay
Admission: RE | Admit: 2016-06-14 | Discharge: 2016-06-17 | DRG: 470 | Disposition: A | Discharge status: Home Health | Payer: Medicare Other | Source: Ambulatory Visit | Attending: Orthopedic Surgery | Admitting: Orthopedic Surgery

## 2016-06-14 ENCOUNTER — Encounter: Payer: Self-pay | Admitting: Orthopedic Surgery

## 2016-06-14 ENCOUNTER — Encounter
Admission: RE | Disposition: A | Discharge status: Home Health | Payer: Self-pay | Source: Ambulatory Visit | Attending: Orthopedic Surgery

## 2016-06-14 DIAGNOSIS — Z791 Long term (current) use of non-steroidal anti-inflammatories (NSAID): Secondary | ICD-10-CM | POA: Diagnosis not present

## 2016-06-14 DIAGNOSIS — Z8249 Family history of ischemic heart disease and other diseases of the circulatory system: Secondary | ICD-10-CM

## 2016-06-14 DIAGNOSIS — I2510 Atherosclerotic heart disease of native coronary artery without angina pectoris: Secondary | ICD-10-CM | POA: Diagnosis present

## 2016-06-14 DIAGNOSIS — Z951 Presence of aortocoronary bypass graft: Secondary | ICD-10-CM

## 2016-06-14 DIAGNOSIS — E7800 Pure hypercholesterolemia, unspecified: Secondary | ICD-10-CM | POA: Diagnosis present

## 2016-06-14 DIAGNOSIS — Z823 Family history of stroke: Secondary | ICD-10-CM | POA: Diagnosis not present

## 2016-06-14 DIAGNOSIS — Z950 Presence of cardiac pacemaker: Secondary | ICD-10-CM

## 2016-06-14 DIAGNOSIS — I10 Essential (primary) hypertension: Secondary | ICD-10-CM | POA: Diagnosis present

## 2016-06-14 DIAGNOSIS — Z87891 Personal history of nicotine dependence: Secondary | ICD-10-CM | POA: Diagnosis not present

## 2016-06-14 DIAGNOSIS — Z79899 Other long term (current) drug therapy: Secondary | ICD-10-CM | POA: Diagnosis not present

## 2016-06-14 DIAGNOSIS — I252 Old myocardial infarction: Secondary | ICD-10-CM

## 2016-06-14 DIAGNOSIS — Z7982 Long term (current) use of aspirin: Secondary | ICD-10-CM

## 2016-06-14 DIAGNOSIS — Z902 Acquired absence of lung [part of]: Secondary | ICD-10-CM

## 2016-06-14 DIAGNOSIS — M79604 Pain in right leg: Secondary | ICD-10-CM

## 2016-06-14 DIAGNOSIS — E039 Hypothyroidism, unspecified: Secondary | ICD-10-CM | POA: Diagnosis present

## 2016-06-14 DIAGNOSIS — Z955 Presence of coronary angioplasty implant and graft: Secondary | ICD-10-CM

## 2016-06-14 DIAGNOSIS — M1611 Unilateral primary osteoarthritis, right hip: Principal | ICD-10-CM | POA: Diagnosis present

## 2016-06-14 DIAGNOSIS — G4730 Sleep apnea, unspecified: Secondary | ICD-10-CM | POA: Diagnosis present

## 2016-06-14 DIAGNOSIS — Z96649 Presence of unspecified artificial hip joint: Secondary | ICD-10-CM

## 2016-06-14 HISTORY — PX: TOTAL HIP ARTHROPLASTY: SHX124

## 2016-06-14 LAB — ABO/RH: ABO/RH(D): B POS

## 2016-06-14 SURGERY — ARTHROPLASTY, HIP, TOTAL,POSTERIOR APPROACH
Anesthesia: Spinal | Site: Hip | Laterality: Right | Wound class: Clean

## 2016-06-14 MED ORDER — GLYCOPYRROLATE 0.2 MG/ML IJ SOLN
INTRAMUSCULAR | Status: DC | PRN
  Administered 2016-06-14: 0.2 mg via INTRAVENOUS

## 2016-06-14 MED ORDER — TRANEXAMIC ACID 1000 MG/10ML IV SOLN
1000.0000 mg | Freq: Once | INTRAVENOUS | Status: AC
  Administered 2016-06-14: 1000 mg via INTRAVENOUS
  Filled 2016-06-14: qty 10

## 2016-06-14 MED ORDER — BUPIVACAINE HCL (PF) 0.5 % IJ SOLN
INTRAMUSCULAR | Status: DC | PRN
  Administered 2016-06-14: 2 mL via INTRATHECAL

## 2016-06-14 MED ORDER — PANTOPRAZOLE SODIUM 40 MG PO TBEC
40.0000 mg | DELAYED_RELEASE_TABLET | Freq: Two times a day (BID) | ORAL | Status: DC
  Administered 2016-06-14 – 2016-06-17 (×6): 40 mg via ORAL
  Filled 2016-06-14 (×6): qty 1

## 2016-06-14 MED ORDER — SODIUM CHLORIDE 0.9 % IV SOLN
INTRAVENOUS | Status: DC | PRN
  Administered 2016-06-14: 15 ug/min via INTRAVENOUS

## 2016-06-14 MED ORDER — LATANOPROST 0.005 % OP SOLN
1.0000 [drp] | Freq: Every day | OPHTHALMIC | Status: DC
  Filled 2016-06-14: qty 2.5

## 2016-06-14 MED ORDER — METOCLOPRAMIDE HCL 10 MG PO TABS
10.0000 mg | ORAL_TABLET | Freq: Three times a day (TID) | ORAL | Status: AC
  Administered 2016-06-14 – 2016-06-16 (×8): 10 mg via ORAL
  Filled 2016-06-14 (×8): qty 1

## 2016-06-14 MED ORDER — ADULT MULTIVITAMIN W/MINERALS CH
1.0000 | ORAL_TABLET | Freq: Every day | ORAL | Status: DC
  Administered 2016-06-15 – 2016-06-17 (×3): 1 via ORAL
  Filled 2016-06-14 (×3): qty 1

## 2016-06-14 MED ORDER — DIPHENHYDRAMINE HCL 12.5 MG/5ML PO ELIX
12.5000 mg | ORAL_SOLUTION | ORAL | Status: DC | PRN
  Administered 2016-06-14 – 2016-06-17 (×3): 25 mg via ORAL
  Filled 2016-06-14 (×3): qty 10

## 2016-06-14 MED ORDER — ENOXAPARIN SODIUM 30 MG/0.3ML ~~LOC~~ SOLN
30.0000 mg | Freq: Two times a day (BID) | SUBCUTANEOUS | Status: DC
Start: 2016-06-15 — End: 2016-06-17
  Administered 2016-06-15 – 2016-06-17 (×5): 30 mg via SUBCUTANEOUS
  Filled 2016-06-14 (×5): qty 0.3

## 2016-06-14 MED ORDER — CLINDAMYCIN PHOSPHATE 900 MG/50ML IV SOLN
900.0000 mg | INTRAVENOUS | Status: AC
  Administered 2016-06-14: 900 mg via INTRAVENOUS

## 2016-06-14 MED ORDER — OXYCODONE HCL 5 MG PO TABS
5.0000 mg | ORAL_TABLET | ORAL | Status: DC | PRN
  Administered 2016-06-14 (×2): 5 mg via ORAL
  Administered 2016-06-15 – 2016-06-17 (×8): 10 mg via ORAL
  Filled 2016-06-14: qty 2
  Filled 2016-06-14: qty 1
  Filled 2016-06-14 (×7): qty 2
  Filled 2016-06-14: qty 1

## 2016-06-14 MED ORDER — ACETAMINOPHEN 10 MG/ML IV SOLN
INTRAVENOUS | Status: DC | PRN
  Administered 2016-06-14: 1000 mg via INTRAVENOUS

## 2016-06-14 MED ORDER — EPHEDRINE SULFATE 50 MG/ML IJ SOLN
INTRAMUSCULAR | Status: DC | PRN
  Administered 2016-06-14 (×4): 5 mg via INTRAVENOUS

## 2016-06-14 MED ORDER — CHLORHEXIDINE GLUCONATE 4 % EX LIQD
60.0000 mL | Freq: Once | CUTANEOUS | Status: AC
  Administered 2016-06-14: 4 via TOPICAL

## 2016-06-14 MED ORDER — SODIUM CHLORIDE 0.9 % IV SOLN
INTRAVENOUS | Status: DC
  Administered 2016-06-14 – 2016-06-15 (×2): via INTRAVENOUS

## 2016-06-14 MED ORDER — FLEET ENEMA 7-19 GM/118ML RE ENEM: 1.0000 | ENEMA | Freq: Once | RECTAL | Status: DC | PRN

## 2016-06-14 MED ORDER — ACETAMINOPHEN 10 MG/ML IV SOLN
INTRAVENOUS | Status: AC
  Filled 2016-06-14: qty 100

## 2016-06-14 MED ORDER — BISACODYL 10 MG RE SUPP
10.0000 mg | Freq: Every day | RECTAL | Status: DC | PRN
  Filled 2016-06-14 (×2): qty 1

## 2016-06-14 MED ORDER — KETAMINE HCL 10 MG/ML IJ SOLN
INTRAMUSCULAR | Status: DC | PRN
  Administered 2016-06-14: 50 mg via INTRAVENOUS

## 2016-06-14 MED ORDER — PHENOL 1.4 % MT LIQD: 1.0000 | OROMUCOSAL | Status: DC | PRN

## 2016-06-14 MED ORDER — LACTATED RINGERS IV SOLN
INTRAVENOUS | Status: DC
  Administered 2016-06-14 (×2): via INTRAVENOUS

## 2016-06-14 MED ORDER — LEVOTHYROXINE SODIUM 25 MCG PO TABS
25.0000 ug | ORAL_TABLET | Freq: Every day | ORAL | Status: DC
  Administered 2016-06-15 – 2016-06-17 (×3): 25 ug via ORAL
  Filled 2016-06-14 (×3): qty 1

## 2016-06-14 MED ORDER — FERROUS SULFATE 325 (65 FE) MG PO TABS
325.0000 mg | ORAL_TABLET | Freq: Two times a day (BID) | ORAL | Status: DC
  Administered 2016-06-15 – 2016-06-17 (×5): 325 mg via ORAL
  Filled 2016-06-14 (×5): qty 1

## 2016-06-14 MED ORDER — TRANEXAMIC ACID 1000 MG/10ML IV SOLN
1000.0000 mg | INTRAVENOUS | Status: AC
  Administered 2016-06-14: 1000 mg via INTRAVENOUS
  Filled 2016-06-14: qty 10

## 2016-06-14 MED ORDER — ONDANSETRON HCL 4 MG PO TABS: 4.0000 mg | ORAL_TABLET | Freq: Four times a day (QID) | ORAL | Status: DC | PRN

## 2016-06-14 MED ORDER — LIDOCAINE HCL (PF) 2 % IJ SOLN
INTRAMUSCULAR | Status: DC | PRN
  Administered 2016-06-14: 50 mg

## 2016-06-14 MED ORDER — FENTANYL CITRATE (PF) 100 MCG/2ML IJ SOLN
INTRAMUSCULAR | Status: DC | PRN
  Administered 2016-06-14 (×2): 50 ug via INTRAVENOUS

## 2016-06-14 MED ORDER — SENNOSIDES-DOCUSATE SODIUM 8.6-50 MG PO TABS
1.0000 | ORAL_TABLET | Freq: Two times a day (BID) | ORAL | Status: DC
Start: 2016-06-14 — End: 2016-06-17
  Administered 2016-06-14 – 2016-06-17 (×6): 1 via ORAL
  Filled 2016-06-14 (×6): qty 1

## 2016-06-14 MED ORDER — FENTANYL CITRATE (PF) 100 MCG/2ML IJ SOLN: 25.0000 ug | INTRAMUSCULAR | Status: DC | PRN

## 2016-06-14 MED ORDER — MENTHOL 3 MG MT LOZG
1.0000 | LOZENGE | OROMUCOSAL | Status: DC | PRN
Start: 2016-06-14 — End: 2016-06-17

## 2016-06-14 MED ORDER — FAMOTIDINE 20 MG PO TABS
ORAL_TABLET | ORAL | Status: AC
  Administered 2016-06-14: 20 mg via ORAL
  Filled 2016-06-14: qty 1

## 2016-06-14 MED ORDER — MAGNESIUM HYDROXIDE 400 MG/5ML PO SUSP
30.0000 mL | Freq: Every day | ORAL | Status: DC | PRN
  Administered 2016-06-15 – 2016-06-17 (×3): 30 mL via ORAL
  Filled 2016-06-14 (×3): qty 30

## 2016-06-14 MED ORDER — ONDANSETRON HCL 4 MG/2ML IJ SOLN: 4.0000 mg | Freq: Once | INTRAMUSCULAR | Status: DC | PRN

## 2016-06-14 MED ORDER — METOPROLOL TARTRATE 50 MG PO TABS
50.0000 mg | ORAL_TABLET | Freq: Two times a day (BID) | ORAL | Status: DC
  Administered 2016-06-14 – 2016-06-17 (×5): 50 mg via ORAL
  Filled 2016-06-14 (×6): qty 1

## 2016-06-14 MED ORDER — ACETAMINOPHEN 325 MG PO TABS: 650.0000 mg | ORAL_TABLET | Freq: Four times a day (QID) | ORAL | Status: DC | PRN

## 2016-06-14 MED ORDER — ASPIRIN EC 81 MG PO TBEC
81.0000 mg | DELAYED_RELEASE_TABLET | Freq: Every day | ORAL | Status: DC
  Administered 2016-06-15 – 2016-06-17 (×3): 81 mg via ORAL
  Filled 2016-06-14 (×4): qty 1

## 2016-06-14 MED ORDER — TRAMADOL HCL 50 MG PO TABS
50.0000 mg | ORAL_TABLET | ORAL | Status: DC | PRN
  Administered 2016-06-14 (×2): 50 mg via ORAL
  Administered 2016-06-15: 100 mg via ORAL
  Administered 2016-06-15: 50 mg via ORAL
  Administered 2016-06-15 – 2016-06-17 (×3): 100 mg via ORAL
  Filled 2016-06-14: qty 1
  Filled 2016-06-14: qty 2
  Filled 2016-06-14: qty 1
  Filled 2016-06-14 (×3): qty 2
  Filled 2016-06-14: qty 1

## 2016-06-14 MED ORDER — CEFAZOLIN SODIUM-DEXTROSE 2-4 GM/100ML-% IV SOLN
2.0000 g | Freq: Four times a day (QID) | INTRAVENOUS | Status: AC
  Administered 2016-06-14 – 2016-06-15 (×4): 2 g via INTRAVENOUS
  Filled 2016-06-14 (×4): qty 100

## 2016-06-14 MED ORDER — ALUM & MAG HYDROXIDE-SIMETH 200-200-20 MG/5ML PO SUSP: 30.0000 mL | ORAL | Status: DC | PRN

## 2016-06-14 MED ORDER — NEOMYCIN-POLYMYXIN B GU 40-200000 IR SOLN
Status: DC | PRN
  Administered 2016-06-14: 16 mL

## 2016-06-14 MED ORDER — FAMOTIDINE 20 MG PO TABS
20.0000 mg | ORAL_TABLET | Freq: Once | ORAL | Status: AC
  Administered 2016-06-14: 20 mg via ORAL

## 2016-06-14 MED ORDER — ONDANSETRON HCL 4 MG/2ML IJ SOLN: 4.0000 mg | Freq: Four times a day (QID) | INTRAMUSCULAR | Status: DC | PRN

## 2016-06-14 MED ORDER — MORPHINE SULFATE (PF) 2 MG/ML IV SOLN
2.0000 mg | INTRAVENOUS | Status: DC | PRN
  Administered 2016-06-15: 2 mg via INTRAVENOUS
  Filled 2016-06-14 (×2): qty 1

## 2016-06-14 MED ORDER — SIMVASTATIN 20 MG PO TABS
40.0000 mg | ORAL_TABLET | Freq: Every day | ORAL | Status: DC
  Administered 2016-06-14 – 2016-06-16 (×3): 40 mg via ORAL
  Filled 2016-06-14 (×3): qty 2

## 2016-06-14 MED ORDER — PROPOFOL 500 MG/50ML IV EMUL
INTRAVENOUS | Status: DC | PRN
  Administered 2016-06-14: 50 ug/kg/min via INTRAVENOUS

## 2016-06-14 MED ORDER — TETRACAINE HCL 1 % IJ SOLN
INTRAMUSCULAR | Status: DC | PRN
  Administered 2016-06-14: 10 mg via INTRASPINAL

## 2016-06-14 MED ORDER — ACETAMINOPHEN 10 MG/ML IV SOLN
1000.0000 mg | Freq: Four times a day (QID) | INTRAVENOUS | Status: AC
  Administered 2016-06-14 – 2016-06-15 (×4): 1000 mg via INTRAVENOUS
  Filled 2016-06-14 (×4): qty 100

## 2016-06-14 MED ORDER — TETRACAINE HCL 1 % IJ SOLN
INTRAMUSCULAR | Status: AC
  Filled 2016-06-14: qty 2

## 2016-06-14 MED ORDER — CLINDAMYCIN PHOSPHATE 900 MG/50ML IV SOLN
INTRAVENOUS | Status: AC
  Filled 2016-06-14: qty 50

## 2016-06-14 MED ORDER — MIDAZOLAM HCL 5 MG/5ML IJ SOLN
INTRAMUSCULAR | Status: DC | PRN
  Administered 2016-06-14: 2 mg via INTRAVENOUS

## 2016-06-14 MED ORDER — ACETAMINOPHEN 650 MG RE SUPP: 650.0000 mg | Freq: Four times a day (QID) | RECTAL | Status: DC | PRN

## 2016-06-14 SURGICAL SUPPLY — 51 items
BLADE DRUM FLTD (BLADE) ×2 IMPLANT
BLADE SAW 1 (BLADE) ×2 IMPLANT
CANISTER SUCT 1200ML W/VALVE (MISCELLANEOUS) ×2 IMPLANT
CANISTER SUCT 3000ML (MISCELLANEOUS) ×4 IMPLANT
CAPT HIP TOTAL 2 ×2 IMPLANT
CARTRIDGE OIL MAESTRO DRILL (MISCELLANEOUS) ×1 IMPLANT
CATH FOL LEG HOLDER (MISCELLANEOUS) ×2 IMPLANT
CATH TRAY METER 16FR LF (MISCELLANEOUS) ×2 IMPLANT
DIFFUSER MAESTRO (MISCELLANEOUS) ×2 IMPLANT
DRAPE INCISE IOBAN 66X60 STRL (DRAPES) ×2 IMPLANT
DRAPE SHEET LG 3/4 BI-LAMINATE (DRAPES) ×2 IMPLANT
DRSG DERMACEA 8X12 NADH (GAUZE/BANDAGES/DRESSINGS) ×2 IMPLANT
DRSG OPSITE POSTOP 3X4 (GAUZE/BANDAGES/DRESSINGS) ×2 IMPLANT
DRSG OPSITE POSTOP 4X12 (GAUZE/BANDAGES/DRESSINGS) ×2 IMPLANT
DRSG OPSITE POSTOP 4X14 (GAUZE/BANDAGES/DRESSINGS) IMPLANT
DRSG TEGADERM 4X4.75 (GAUZE/BANDAGES/DRESSINGS) ×2 IMPLANT
DURAPREP 26ML APPLICATOR (WOUND CARE) ×2 IMPLANT
ELECT BLADE 6.5 EXT (BLADE) ×2 IMPLANT
ELECT CAUTERY BLADE 6.4 (BLADE) ×2 IMPLANT
GLOVE BIOGEL M STRL SZ7.5 (GLOVE) ×4 IMPLANT
GLOVE INDICATOR 8.0 STRL GRN (GLOVE) ×2 IMPLANT
GLOVE SURG 9.0 ORTHO LTXF (GLOVE) ×2 IMPLANT
GLOVE SURG ORTHO 9.0 STRL STRW (GLOVE) ×2 IMPLANT
GOWN STRL REUS W/ TWL LRG LVL3 (GOWN DISPOSABLE) ×2 IMPLANT
GOWN STRL REUS W/TWL 2XL LVL3 (GOWN DISPOSABLE) ×2 IMPLANT
GOWN STRL REUS W/TWL LRG LVL3 (GOWN DISPOSABLE) ×2
HANDPIECE INTERPULSE COAX TIP (DISPOSABLE) ×1
HEMOVAC 400CC 10FR (MISCELLANEOUS) ×2 IMPLANT
HOOD PEEL AWAY FLYTE STAYCOOL (MISCELLANEOUS) ×4 IMPLANT
KIT RM TURNOVER STRD PROC AR (KITS) ×2 IMPLANT
NDL SAFETY 18GX1.5 (NEEDLE) ×2 IMPLANT
NS IRRIG 500ML POUR BTL (IV SOLUTION) ×2 IMPLANT
OIL CARTRIDGE MAESTRO DRILL (MISCELLANEOUS) ×2
PACK HIP PROSTHESIS (MISCELLANEOUS) ×2 IMPLANT
PIN STEIN THRED 5/32 (Pin) ×2 IMPLANT
SET HNDPC FAN SPRY TIP SCT (DISPOSABLE) ×1 IMPLANT
SOL .9 NS 3000ML IRR  AL (IV SOLUTION) ×1
SOL .9 NS 3000ML IRR UROMATIC (IV SOLUTION) ×1 IMPLANT
SOL PREP PVP 2OZ (MISCELLANEOUS) ×2
SOLUTION PREP PVP 2OZ (MISCELLANEOUS) ×1 IMPLANT
SPONGE DRAIN TRACH 4X4 STRL 2S (GAUZE/BANDAGES/DRESSINGS) ×2 IMPLANT
STAPLER SKIN PROX 35W (STAPLE) ×2 IMPLANT
SUT ETHIBOND #5 BRAIDED 30INL (SUTURE) ×2 IMPLANT
SUT VIC AB 0 CT1 36 (SUTURE) ×2 IMPLANT
SUT VIC AB 1 CT1 36 (SUTURE) ×4 IMPLANT
SUT VIC AB 2-0 CT1 27 (SUTURE) ×1
SUT VIC AB 2-0 CT1 TAPERPNT 27 (SUTURE) ×1 IMPLANT
SYR 20CC LL (SYRINGE) ×2 IMPLANT
TAPE ADH 3 LX (MISCELLANEOUS) ×2 IMPLANT
TAPE TRANSPORE STRL 2 31045 (GAUZE/BANDAGES/DRESSINGS) ×2 IMPLANT
TOWEL OR 17X26 4PK STRL BLUE (TOWEL DISPOSABLE) ×2 IMPLANT

## 2016-06-14 NOTE — Brief Op Note (Signed)
06/14/2016  3:26 PM  PATIENT:  Jose [name]  75 y.o. male  PRE-OPERATIVE DIAGNOSIS:  osteoarthritis of the right hip  POST-OPERATIVE DIAGNOSIS: Same  PROCEDURE:  Procedure(s): TOTAL HIP ARTHROPLASTY (Right)  SURGEON:  Surgeon(s) and Role:    * Donato HeinzJames P Destanie Tibbetts, MD - Primary  ASSISTANTS: Van ClinesJon Wolfe, PA   ANESTHESIA:   spinal  EBL:  Total I/O In: 1700 [I.V.:1700] Out: 500 [Urine:400; Blood:100]  BLOOD ADMINISTERED:none  DRAINS: 2 medium Hemovac drains   LOCAL MEDICATIONS USED:  NONE  SPECIMEN:  Source of Specimen:  Right femoral head  DISPOSITION OF SPECIMEN:  PATHOLOGY  COUNTS:  YES  TOURNIQUET:  * No tourniquets in log *  DICTATION: .Dragon Dictation  PLAN OF CARE: Admit to inpatient   PATIENT DISPOSITION:  PACU - hemodynamically stable.   Delay start of Pharmacological VTE agent (>24hrs) due to surgical blood loss or risk of bleeding: yes

## 2016-06-14 NOTE — Transfer of Care (Signed)
Immediate Anesthesia Transfer of Care Note  Patient: Rafi D Wingert  Procedure(s) Performed: Procedure(s): TOTAL HIP ARTHROPLASTY (Right)  Patient Location: PACU  Anesthesia Type:Spinal  Level of Consciousness: sedated  Airway & Oxygen Therapy: Patient Spontanous Breathing and Patient connected to face mask oxygen  Post-op Assessment: Report given to RN and Post -op Vital signs reviewed and stable  Post vital signs: Reviewed and stable  Last Vitals:  Vitals:   06/14/16 1013 06/14/16 1515  BP:  125/61  Pulse: 69 63  Resp: 16 17  Temp: 36.9 C 36.2 C    Last Pain:  Vitals:   06/14/16 1515  TempSrc: Tympanic  PainSc:          Complications: No apparent anesthesia complications

## 2016-06-14 NOTE — H&P (Signed)
The patient has been re-examined, and the chart reviewed, and there have been no interval changes to the documented history and physical.    The risks, benefits, and alternatives have been discussed at length. The patient expressed understanding of the risks benefits and agreed with plans for surgical intervention.  James P. Hooten, Jr. M.D.    

## 2016-06-14 NOTE — Anesthesia Preprocedure Evaluation (Signed)
Anesthesia Evaluation  Patient identified by MRN, date of birth, ID band Patient awake    Reviewed: Allergy & Precautions, NPO status , Patient's Chart, lab work & pertinent test results  History of Anesthesia Complications Negative for: history of anesthetic complications  Airway Mallampati: II       Dental   Pulmonary sleep apnea , former smoker,           Cardiovascular hypertension, + CAD and + Past MI  + dysrhythmias (tachy/brady) + pacemaker      Neuro/Psych negative neurological ROS     GI/Hepatic   Endo/Other  Hypothyroidism   Renal/GU      Musculoskeletal   Abdominal   Peds  Hematology   Anesthesia Other Findings   Reproductive/Obstetrics                             Anesthesia Physical Anesthesia Plan  ASA: II  Anesthesia Plan: Spinal   Post-op Pain Management:    Induction: Intravenous  Airway Management Planned:   Additional Equipment:   Intra-op Plan:   Post-operative Plan:   Informed Consent: I have reviewed the patients History and Physical, chart, labs and discussed the procedure including the risks, benefits and alternatives for the proposed anesthesia with the patient or authorized representative who has indicated his/her understanding and acceptance.     Plan Discussed with:   Anesthesia Plan Comments:         Anesthesia Quick Evaluation

## 2016-06-14 NOTE — Op Note (Signed)
OPERATIVE NOTE  DATE OF SURGERY:  06/14/2016  PATIENT NAME:  Jose [name]   DOB: 1941/04/16  MRN: [id number]  PRE-OPERATIVE DIAGNOSIS: Degenerative arthrosis of the right hip, primary  POST-OPERATIVE DIAGNOSIS:  Same  PROCEDURE:  Right total hip arthroplasty  SURGEON:  Jena GaussJames P Keshanna Riso, Jr. M.D.  ASSISTANT:  Van ClinesJon Wolfe, PA (present and scrubbed throughout the case, critical for assistance with exposure, retraction, instrumentation, and closure)  ANESTHESIA: spinal  ESTIMATED BLOOD LOSS: 100 mL  FLUIDS REPLACED: 1700 mL of crystalloid  DRAINS: 2 medium drains to a Hemovac reservoir  IMPLANTS UTILIZED: DePuy 16.5 mm large stature AML femoral stem, 62 mm OD Pinnacle 100 acetabular component, +4 mm neutral Pinnacle Marathon polyethylene insert, and a 36 mm M-SPEC +1.5 mm hip ball  INDICATIONS FOR SURGERY: Jose FrederickMac D Keltner is a 75 y.o. year old male with a long history of progressive hip and groin  pain. X-rays demonstrated severe degenerative changes. The patient had not seen any significant improvement despite conservative nonsurgical intervention. After discussion of the risks and benefits of surgical intervention, the patient expressed understanding of the risks benefits and agree with plans for total hip arthroplasty.   The risks, benefits, and alternatives were discussed at length including but not limited to the risks of infection, bleeding, nerve injury, stiffness, blood clots, the need for revision surgery, limb length inequality, dislocation, cardiopulmonary complications, among others, and they were willing to proceed.  PROCEDURE IN DETAIL: The patient was brought into the operating room and, after adequate spinal anesthesia was achieved, the patient was placed in a left lateral decubitus position. Axillary roll was placed and all bony prominences were well-padded. The patient's right hip was cleaned and prepped with alcohol and DuraPrep and draped in the usual sterile fashion. A  "timeout" was performed as per usual protocol. A lateral curvilinear incision was made gently curving towards the posterior superior iliac spine. The IT band was incised in line with the skin incision and the fibers of the gluteus maximus were split in line. The piriformis tendon was identified, skeletonized, and incised at its insertion to the proximal femur and reflected posteriorly. A T type posterior capsulotomy was performed. Prior to dislocation of the femoral head, a threaded Steinmann pin was inserted through a separate stab incision into the pelvis superior to the acetabulum and bent in the form of a stylus so as to assess limb length and hip offset throughout the procedure. The femoral head was then dislocated posteriorly. Inspection of the femoral head demonstrated severe degenerative changes with full-thickness loss of articular cartilage. The femoral neck cut was performed using an oscillating saw. The anterior capsule was elevated off of the femoral neck using a periosteal elevator. Attention was then directed to the acetabulum. The remnant of the labrum was excised using electrocautery. Inspection of the acetabulum also demonstrated significant degenerative changes. The acetabulum was reamed in sequential fashion up to a 61 mm diameter. Good punctate bleeding bone was encountered. A 62 mm Pinnacle 100 acetabular component was positioned and impacted into place. Good scratch fit was appreciated. A +4 mm neutral polyethylene trial was inserted.  Attention was then directed to the proximal femur. A hole for reaming of the proximal femoral canal was created using a high-speed burr. The femoral canal was reamed in sequential fashion up to a 16.5 mm diameter. This allowed for approximately 5 cm of scratch fit. Serial broaches were inserted up to a 16.5 mm large stature femoral broach. Calcar region was planed and a  trial reduction was performed using a 36 mm hip ball with a +1.5 mm neck length. Good  equalization of limb lengths and hip offset was appreciated and excellent stability was noted both anteriorly and posteriorly. Trial components were removed. The acetabular shell was irrigated with copious amounts of normal saline with antibiotic solution and suctioned dry. A +4 mm neutral Pinnacle Marathon polyethylene insert was positioned and impacted into place. Next, a 16.5 mm large stature AML femoral stem was positioned and impacted into place. Excellent scratch fit was appreciated. A trial reduction was again performed with a 36 mm hip ball with a +1.5 mm neck length. Again, good equalization of limb lengths was appreciated and excellent stability appreciated both anteriorly and posteriorly. The hip was then dislocated and the trial hip ball was removed. The Morse taper was cleaned and dried. A 36 mm M-SPEC hip ball with a +1.5 mm neck length was placed on the trunnion and impacted into place. The hip was then reduced and placed through range of motion. Excellent stability was appreciated both anteriorly and posteriorly.  The wound was irrigated with copious amounts of normal saline with antibiotic solution and suctioned dry. Good hemostasis was appreciated. The posterior capsulotomy was repaired using #5 Ethibond. Piriformis tendon was reapproximated to the undersurface of the gluteus medius tendon using #5 Ethibond. Two medium drains were placed in the wound bed and brought out through separate stab incisions to be attached to a Hemovac reservoir. The IT band was reapproximated using interrupted sutures of #1 Vicryl. Subcutaneous tissue was approximated using first #0 Vicryl followed by #2-0 Vicryl. The skin was closed with skin staples.  The patient tolerated the procedure well and was transported to the recovery room in stable condition.   Jena Gauss., M.D.

## 2016-06-14 NOTE — Anesthesia Procedure Notes (Signed)
Spinal  Patient location during procedure: OR Staffing Anesthesiologist: Gunnar Fusi Resident/CRNA: Rolla Plate Performed: resident/CRNA  Preanesthetic Checklist Completed: patient identified, site marked, surgical consent, pre-op evaluation, timeout performed, IV checked, risks and benefits discussed and monitors and equipment checked Spinal Block Patient position: sitting Prep: Betadine and site prepped and draped Patient monitoring: heart rate, continuous pulse ox, blood pressure and cardiac monitor Approach: midline Location: L3-4 Injection technique: single-shot Needle Needle type: Whitacre and Introducer  Needle gauge: 24 G Needle length: 12.7 cm Additional Notes Negative paresthesia. Negative blood return. Positive free-flowing CSF. Expiration date of kit checked and confirmed. Patient tolerated procedure well, without complications.

## 2016-06-15 ENCOUNTER — Encounter: Payer: Self-pay | Admitting: Orthopedic Surgery

## 2016-06-15 ENCOUNTER — Encounter: Payer: Medicare Other | Admitting: Internal Medicine

## 2016-06-15 LAB — CBC
HEMATOCRIT: 37.5 % — AB (ref 40.0–52.0)
Hemoglobin: 13.3 g/dL (ref 13.0–18.0)
MCH: 34.3 pg — ABNORMAL HIGH (ref 26.0–34.0)
MCHC: 35.5 g/dL (ref 32.0–36.0)
MCV: 96.8 fL (ref 80.0–100.0)
Platelets: 132 10*3/uL — ABNORMAL LOW (ref 150–440)
RBC: 3.87 MIL/uL — ABNORMAL LOW (ref 4.40–5.90)
RDW: 13.8 % (ref 11.5–14.5)
WBC: 7.6 10*3/uL (ref 3.8–10.6)

## 2016-06-15 LAB — BASIC METABOLIC PANEL
ANION GAP: 7 (ref 5–15)
BUN: 10 mg/dL (ref 6–20)
CHLORIDE: 100 mmol/L — AB (ref 101–111)
CO2: 26 mmol/L (ref 22–32)
CREATININE: 0.77 mg/dL (ref 0.61–1.24)
Calcium: 8.4 mg/dL — ABNORMAL LOW (ref 8.9–10.3)
GFR calc Af Amer: >60 mL/min (ref >60)
GLUCOSE: 109 mg/dL — AB (ref 65–99)
POTASSIUM: 4.1 mmol/L (ref 3.5–5.1)
Sodium: 133 mmol/L — ABNORMAL LOW (ref 135–145)

## 2016-06-15 MED ORDER — TRAMADOL HCL 50 MG PO TABS: 50.0000 mg | ORAL_TABLET | ORAL | 1 refills | Status: DC | PRN

## 2016-06-15 MED ORDER — ENOXAPARIN SODIUM 40 MG/0.4ML ~~LOC~~ SOLN: 30.0000 mg | SUBCUTANEOUS | 0 refills | Status: DC

## 2016-06-15 MED ORDER — OXYCODONE HCL 5 MG PO TABS: 5.0000 mg | ORAL_TABLET | ORAL | 0 refills | Status: DC | PRN

## 2016-06-15 NOTE — Evaluation (Signed)
Physical Therapy Evaluation Patient Details Name: Jose [name] MRN: [id number] DOB: [birth year] Today's Date: 06/15/2016   History of Present Illness  Jose [name] is a 75 y.o. year old male with a long history of progressive hip and groin  pain. X-rays demonstrated severe degenerative changes and underwent elective R THA by Dr Ernest Pine with posterior hip precautions and WBAT on 06/14/16.  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    Clinical Impression  Pt is a pleasant 75 year old male who was admitted for R THA on 06/14/16. Pt performs bed mobility/transfers with min assist and ambulation with cga and RW. Pt demonstrates deficits with strength/mobility/pain. Pt educated on hip precautions and appears motivated to perform therapy. Would benefit from skilled PT to address above deficits and promote optimal return to PLOF. Recommend transition to HHPT upon discharge from acute hospitalization.       Follow Up Recommendations Home health PT    Equipment Recommendations       Recommendations for Other Services       Precautions / Restrictions Precautions Precautions: Posterior Hip;Fall Precaution Booklet Issued: No Restrictions Weight Bearing Restrictions: Yes RLE Weight Bearing: Weight bearing as tolerated      Mobility  Bed Mobility Overal bed mobility: Needs Assistance Bed Mobility: Supine to Sit     Supine to sit: Min assist     General bed mobility comments: assist for sequencing and sliding B LE off bed. Cues given for using B UE and pushing up on elbow/hands in order to scoot out towards EOB.  Once seated, pt able to sit with supervision  Transfers Overall transfer level: Needs assistance Equipment used: Rolling walker (2 wheeled) Transfers: Sit to/from Stand Sit to Stand: Min assist         General transfer comment:  transfers performed with RW and safe technique. Slight assist to weight shift to L LE in order to slide R LE under body and weight bear. Pt stands with slightly forward flexed posture.  Ambulation/Gait Ambulation/Gait assistance: Min guard Ambulation Distance (Feet): 15 Feet Assistive device: Rolling walker (2 wheeled) Gait Pattern/deviations: Step-to pattern     General Gait Details: ambulated using rw and cga. Slow step to gait pattern noted with cues for upright posture. Also required cues for maintaining hip precautions  Stairs            Wheelchair Mobility    Modified Rankin (Stroke Patients Only)       Balance Overall balance assessment: Needs assistance Sitting-balance support: Feet supported Sitting balance-Leahy Scale: Good     Standing balance support: Bilateral upper extremity supported Standing balance-Leahy Scale: Good                               Pertinent Vitals/Pain Pain Assessment: 0-10 Pain Score: 3  Pain Location: R hip Pain Descriptors / Indicators: Operative site guarding Pain Intervention(s): Limited activity within patient's tolerance;Patient requesting pain meds-RN notified;RN gave pain meds during session;Ice applied    Home Living Family/patient expects to be discharged to:: Private residence Living Arrangements: Spouse/significant other Available Help at Discharge: Family;Available 24 hours/day Type of Home: House Home Access: Stairs to enter Entrance Stairs-Rails: None Entrance Stairs-Number of Steps: 2 Home Layout: Two level;Able to live on main level with bedroom/bathroom Home Equipment: Dan Humphreys - 2  wheels;Bedside commode;Cane - single point;Crutches Additional Comments: Pt also has alternate entry with 4 stairs with 1 railing.     Prior Function Level of Independence: Independent         Comments: limited mobility prior secondary to pain     Hand Dominance   Dominant Hand: Right    Extremity/Trunk  Assessment   Upper Extremity Assessment: Overall WFL for tasks assessed           Lower Extremity Assessment: Generalized weakness (L LE grossly 5/5; R LE grossly 3+/5)         Communication   Communication: No difficulties  Cognition Arousal/Alertness: Awake/alert Behavior During Therapy: WFL for tasks assessed/performed Overall Cognitive Status: Within Functional Limits for tasks assessed                      General Comments      Exercises Other Exercises Other Exercises: Supine ther-ex performed including R LE ankle pumps, glut sets, quad sets, and hip ab/add. ALl ther-ex performed x 10 reps with min assist. Cues given for correct technique   Assessment/Plan    PT Assessment Patient needs continued PT services  PT Problem List Decreased strength;Decreased mobility;Pain;Decreased knowledge of use of DME;Decreased knowledge of precautions          PT Treatment Interventions DME instruction;Gait training;Stair training;Therapeutic exercise    PT Goals (Current goals can be found in the Care Plan section)  Acute Rehab PT Goals Patient Stated Goal: to go home PT Goal Formulation: With patient Time For Goal Achievement: 06/29/16 Potential to Achieve Goals: Good    Frequency BID   Barriers to discharge        Co-evaluation               End of Session Equipment Utilized During Treatment: Gait belt Activity Tolerance: Patient tolerated treatment well Patient left: in chair;with chair alarm set Nurse Communication: Mobility status;Precautions         Time: [id number] PT Time Calculation (min) (ACUTE ONLY): 28 min   Charges:   PT Evaluation $PT Eval Moderate Complexity: 1 Procedure PT Treatments $Therapeutic Exercise: 8-22 mins   PT G Codes:        Maybelle Depaoli 06/15/2016, 11:54 AM  Elizabeth PalauStephanie Adalia Pettis, PT, DPT [phone]

## 2016-06-15 NOTE — Anesthesia Postprocedure Evaluation (Signed)
Anesthesia Post Note  Patient: Jose [name]  Procedure(s) Performed: Procedure(s) (LRB): TOTAL HIP ARTHROPLASTY (Right)  Patient location during evaluation: Other Anesthesia Type: Spinal Level of consciousness: awake and alert Pain management: pain level controlled Vital Signs Assessment: post-procedure vital signs reviewed and stable Respiratory status: spontaneous breathing Cardiovascular status: blood pressure returned to baseline Postop Assessment: no headache, no backache and spinal receding Anesthetic complications: no    Last Vitals:  Vitals:   06/15/16 0432 06/15/16 0759  BP: (!) 106/53 (!) 110/56  Pulse: 74 72  Resp: 20 18  Temp: 36.6 C 37.2 C    Last Pain:  Vitals:   06/15/16 0759  TempSrc: Oral  PainSc:                  [name],  [name]

## 2016-06-15 NOTE — Discharge Instructions (Signed)

## 2016-06-15 NOTE — Progress Notes (Signed)
Clinical Social Worker (CSW) received SNF consult. PT is recommending home health. RN case manager is aware of above. Please reconsult if future social work needs arise. CSW signing off.   Shawnna Pancake, LCSW [phone] 

## 2016-06-15 NOTE — Discharge Summary (Signed)
Physician Discharge Summary  Patient ID: Jose Jose [name] MRN: [id number] DOB/AGE: 1941-02-17 75 y.o.  Admit date: 06/14/2016 Discharge date: 06/16/2016  Admission Diagnoses:  osteoarthritis   Discharge Diagnoses: Patient Active Problem List   Diagnosis Date Noted  . S/P total hip arthroplasty 06/14/2016  . Sinus tachycardia (HCC) 10/07/2015  . S/P CABG x 4 09/23/2015  . Angina pectoris (HCC)   . CAD (coronary artery disease) 06/25/2015  . Atrial fibrillation (HCC) 06/10/2015  . Obstructive sleep apnea 06/10/2015  . Coronary artery disease involving native coronary artery of native heart without angina pectoris 05/02/2015  . Pacemaker 05/02/2015  . Hyperlipidemia 05/02/2015  . History of coronary artery stent placement 05/02/2015  . Sick sinus syndrome (HCC) 05/02/2015    Past Medical History:  Diagnosis Date  . Coronary artery disease 2002   RCA stent, 2002. 50-60% stenosis of proximal LAD, 75% stenosis OM2, patent stent mid RCA by cardiac catheterization, 04/17/07  . Headache    hx of migraines - went away in his 40's  . 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 Hypercholesteremia   . Hypertension   . Hypothyroidism   . MI (myocardial infarction) (HCC)   . Pacemaker      Transfusion: none   Consultants (if any): none  Discharged Condition: Improved  Hospital Course: Jose Jose [name] is an 75 y.o. Jose [name] who was admitted 06/14/2016 with a diagnosis of degenerative arthrosis to right hip  and went to the operating room on 06/14/2016 and underwent the above named procedures.    Surgeries:Procedure(s): TOTAL HIP ARTHROPLASTY on 06/14/2016  PRE-OPERATIVE DIAGNOSIS: Degenerative arthrosis of the right hip, primary  POST-OPERATIVE DIAGNOSIS:  Same  PROCEDURE:  Right total hip arthroplasty  SURGEON:  Jose Gauss. M.D.  ASSISTANT:  Jose Clines, PA (present and scrubbed throughout the case, critical for assistance with exposure, retraction,  instrumentation, and closure)  ANESTHESIA: spinal  ESTIMATED BLOOD LOSS: 100 mL  FLUIDS REPLACED: 1700 mL of crystalloid  DRAINS: 2 medium drains to a Hemovac reservoir  IMPLANTS UTILIZED: DePuy 16.5 mm large stature AML femoral stem, 62 mm OD Pinnacle 100 acetabular component, +4 mm neutral Pinnacle Marathon polyethylene insert, and a 36 mm M-SPEC +1.5 mm hip ball  INDICATIONS FOR SURGERY: Jose Jose Jose [name] with a long history of progressive hip and groin  pain. X-rays demonstrated severe degenerative changes. The patient had not seen any significant improvement despite conservative nonsurgical intervention. After discussion of the risks and benefits of surgical intervention, the patient expressed understanding of the risks benefits and agree with plans for total hip arthroplasty.   The risks, benefits, and alternatives were discussed at length including but not limited to the risks of infection, bleeding, nerve injury, stiffness, blood clots, the need for revision surgery, limb length inequality, dislocation, cardiopulmonary complications, among others, and they were  Patient tolerated the surgery well. No complications .Patient was taken to PACU where she was stabilized and then transferred to the orthopedic floor.  Patient started on Lovenox 30 q 12 hrs along with one 81mg  EC ASA. Foot pumps applied bilaterally at 80 mm hgb. Heels elevated off bed with rolled towels. No evidence of DVT. Calves non tender. Negative Homan. Physical therapy started on day #1 for gait training and transfer with OT starting on  day #1 for ADL and assisted devices. Patient has done well with therapy. Ambulated 200 feet upon being discharged. Was able to go  up and down 4 steps independently.  Patient's IV and foley were d/c on day one and hemovac was d/c on day #2. Dressing was changed on day 2.   He was given perioperative antibiotics:  Anti-infectives    Start     Dose/Rate Route  Frequency Ordered Stop   06/14/16 1800  ceFAZolin (ANCEF) IVPB 2g/100 mL premix     2 g 200 mL/hr over 30 Minutes Intravenous Every 6 hours 06/14/16 1626 06/15/16 1759   06/14/16 1009  clindamycin (CLEOCIN) IVPB 900 mg     900 mg 100 mL/hr over 30 Minutes Intravenous On call to O.R. 06/14/16 1009 06/14/16 1213   06/14/16 0743  clindamycin (CLEOCIN) 900 MG/50ML IVPB    Comments:  Slemenda, Jose Jose [name]: cabinet override      06/14/16 0743 06/14/16 1959    .  He was fitted with AV 1 compression foot pump devices, instructed in heel pump ,early ambulation, and TED stockings for DVT prophylaxis.  He benefited maximally from the hospital stay and there were no complications.    Recent vital signs:  Vitals:   06/14/16 2338 06/15/16 0432  BP: (!) 145/81 (!) 106/53  Pulse: 76 74  Resp: 20 20  Temp: 98.8 F (37.1 C) 97.8 F (36.6 C)    Recent laboratory studies:  Lab Results  Component Value Date   HGB 13.3 06/15/2016   HGB 15.4 06/03/2016   HGB 11.3 (L) 09/25/2015   Lab Results  Component Value Date   WBC 7.6 06/15/2016   PLT 132 (L) 06/15/2016   Lab Results  Component Value Date   INR 1.03 06/03/2016   Lab Results  Component Value Date   NA 133 (L) 06/15/2016   K 4.1 06/15/2016   CL 100 (L) 06/15/2016   CO2 26 06/15/2016   BUN 10 06/15/2016   CREATININE 0.77 06/15/2016   GLUCOSE 109 (H) 06/15/2016    Discharge Medications:     Medication List    TAKE these medications   aspirin 81 MG EC tablet Take 1 tablet (81 mg total) by mouth daily.   enoxaparin 40 MG/0.4ML injection Commonly known as:  LOVENOX Inject 0.3 mLs (30 mg total) into the skin daily.   latanoprost 0.005 % ophthalmic solution Commonly known as:  XALATAN Place 1 drop into both eyes at bedtime.   levothyroxine 25 MCG tablet Commonly known as:  SYNTHROID, LEVOTHROID Take 25 mcg by mouth daily before breakfast.   metoprolol 50 MG tablet Commonly known as:  LOPRESSOR Take 1 tablet (50 mg total)  by mouth 2 (two) times daily.   multivitamin with minerals tablet Take 1 tablet by mouth daily.   oxyCODONE 5 MG immediate release tablet Commonly known as:  Oxy IR/ROXICODONE Take 1-2 tablets (5-10 mg total) by mouth every 4 (four) hours as needed for severe pain.   simvastatin 40 MG tablet Commonly known as:  ZOCOR Take 1 tablet (40 mg total) by mouth daily at 6 PM.   ticagrelor 90 MG Tabs tablet Commonly known as:  BRILINTA Take 1 tablet (90 mg total) by mouth 2 (two) times daily.   traMADol 50 MG tablet Commonly known as:  ULTRAM Take 1-2 tablets (50-100 mg total) by mouth every 4 (four) hours as needed for moderate pain.       Diagnostic Studies: Dg Hip Port Unilat With Pelvis 1v Right  Result Date: 06/14/2016 CLINICAL DATA:  Total hip arthroplasty EXAM: DG HIP (WITH OR WITHOUT PELVIS) 1V PORT RIGHT COMPARISON:  None.  FINDINGS: The recent total hip arthroplasty is located. No periprosthetic fracture. IMPRESSION: No acute finding after total hip arthroplasty. Electronically Signed   By: Marnee SpringJonathon  Watts M.D.   On: 06/14/2016 15:41    Disposition: 01-Home or Self Care  Discharge Instructions    Diet - low sodium heart healthy    Complete by:  As directed    Increase activity slowly    Complete by:  As directed       Follow-up Information    Donato HeinzHOOTEN,JAMES P, MD On 07/27/2016.   Specialty:  Orthopedic Surgery Why:  at 10:45am Contact information: [address] [facility] [place] [id number] [phone]            Signed: Tera PartridgeWOLFE,JON R. 06/15/2016, 7:23 AM

## 2016-06-15 NOTE — Care Management Note (Signed)
Case Management Note  Patient Details  Name: Jose [name] MRN: [id number] Date of Birth: 06/10/1941  Subjective/Objective:   POD 1 right total hip arthroplasty. Met with patient at bedside. He lives at home with his wife who will be patients care taker at DC. Patient states he has all needed DME at home, cane, walker and BSC. Presented choice for home health agencies for PT/OT. He has no agency preference. Referral to [facility]. Need order for home OT added to [facility] order. TC to Marylee Floras, PA regarding anticoagulant at discharge since patient is on ASA and he reports Brilinta. He is to clarify and let RNCM know.                 Action/Plan: OT/PT with [facility]. Please add OT order to [facility] orders.   Expected Discharge Date:                  Expected Discharge Plan:  [place]  In-House Referral:     Discharge planning Services  CM Consult  Post Acute Care Choice:  Home Health Choice offered to:  Patient  DME Arranged:    DME Agency:     HH Arranged:  PT, OT HH Agency:  [facility] (now [facility])  Status of Service:  In process, will continue to follow  If discussed at Long Length of Stay Meetings, dates discussed:    Additional Comments:  Jolly Mango, RN 06/15/2016, 2:03 PM

## 2016-06-15 NOTE — Evaluation (Signed)
Occupational Therapy Evaluation Patient Details Name: Jose [name] MRN: [id number] DOB: 1941-07-21 Today's Date: 06/15/2016    History of Present Illness Jose [name] is a 75 y.o. year old male with a long history of progressive hip and groin  pain. X-rays demonstrated severe degenerative changes and underwent elective R THA by Dr Ernest PineHooten with posterior hip precautions and WBAT.  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     Clinical Impression   Pt is 75 year old male s/p R THA(posterior)  who lives at home with his wife. Pt was independent in all ADLs prior to surgery and is eager to return to PLOF.  Pt is currently limited in functional ADLs due to pain, decreased ROM.  Pt requires mod assist for LB dressing and bathing skills due to pain and decreased AROM of R LE  and would benefit from continued skilled OT services for education in assistive devices, functional mobility, and education in recommendations for home modifications to increase safety and prevent falls.  [facility] to continue [facility].      Follow Up Recommendations  Home health OT    Equipment Recommendations       Recommendations for Other Services       Precautions / Restrictions Precautions Precautions: Posterior Hip;Fall Restrictions Weight Bearing Restrictions: Yes RLE Weight Bearing: Weight bearing as tolerated      Mobility Bed Mobility                  Transfers                      Balance                                            ADL Overall ADL's : Needs assistance/impaired Eating/Feeding: Independent;Set up   Grooming: Wash/dry hands;Wash/dry face;Oral care;Applying deodorant;Brushing hair;Independent;Set up           Upper Body Dressing : Independent;Set up   Lower Body  Dressing: Moderate assistance;With adaptive equipment;Adhering to hip precautions                       Vision     Perception     Praxis      Pertinent Vitals/Pain Pain Assessment: 0-10 Pain Score: 4  Pain Location: R hip Pain Descriptors / Indicators: Aching;Discomfort Pain Intervention(s): Limited activity within patient's tolerance;Monitored during session;Premedicated before session     Hand Dominance Right   Extremity/Trunk Assessment Upper Extremity Assessment Upper Extremity Assessment: Overall WFL for tasks assessed   Lower Extremity Assessment Lower Extremity Assessment: Defer to PT evaluation       Communication Communication Communication: No difficulties   Cognition Arousal/Alertness: Awake/alert Behavior During Therapy: WFL for tasks assessed/performed Overall Cognitive Status: Within Functional Limits for tasks assessed                     General Comments       Exercises       Shoulder Instructions      Home Living Family/patient expects to be discharged to:: Private residence Living Arrangements: Spouse/significant other Available Help at Discharge: Family;Available 24 hours/day Type of  Home: House Home Access: Stairs to enter [employer] of Steps: 2 Entrance Stairs-Rails: None Home Layout: Two level;Able to live on main level with bedroom/bathroom     Bathroom Shower/Tub: Walk-in shower Shower/tub characteristics: Door Bathroom Toilet: Handicapped height Bathroom Accessibility: Yes How Accessible: Accessible via walker Home Equipment: Walker - 2 wheels;Shower seat - built in          Prior Functioning/Environment Level of Independence: Independent                 OT Problem List: Decreased strength;Decreased range of motion;Decreased activity tolerance;Pain   OT Treatment/Interventions: Self-care/ADL training;Patient/family education;DME and/or AE instruction    OT Goals(Current goals can be  found in the care plan section) Acute Rehab OT Goals Patient Stated Goal: to go home OT Goal Formulation: With patient Time For Goal Achievement: 06/29/16 Potential to Achieve Goals: Good ADL Goals Pt Will Perform Lower Body Dressing: with min assist;with adaptive equipment;sit to/from stand (observ hip precautions with FWW and no LOB) Pt Will Transfer to Toilet: with min assist;stand pivot transfer;bedside commode (BSC over toilet using FWW)  OT Frequency: Min 1X/week   Barriers to D/C:            Co-evaluation              End of Session    Activity Tolerance: Patient tolerated treatment well Patient left: in chair;with chair alarm set;with call bell/phone within reach;with nursing/sitter in room   Time: 0920-0953 OT Time Calculation (min): 33 min Charges:  OT General Charges $OT Visit: 1 Procedure OT Evaluation $OT Eval Moderate Complexity: 1 Procedure OT Treatments $Self Care/Home Management : 8-22 mins G-Codes:    Susanne Borders, OTR/L ascom [phone] 06/15/16, 10:59 AM

## 2016-06-15 NOTE — Progress Notes (Signed)
Physical Therapy Treatment Patient Details Name: Jose [name] MRN: [id number] DOB: Apr 23, 1941 Today's Date: 06/15/2016    History of Present Illness Jose [name] is a 75 y.o. year old male with a long history of progressive hip and groin  pain. X-rays demonstrated severe degenerative changes and underwent elective R THA by Dr Ernest Pine with posterior hip precautions and WBAT on 06/14/16.  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      PT Comments    Pt is making good progress towards goals with increased ambulation this session. Pt able to improve to reciprocal gait pattern. Pt very motivated to perform therapy. Good progress with there-ex. Still needs min assist for bed mobility and bed elevated for transfers. Able to state 2/3 hip precautions.  Follow Up Recommendations  Home health PT     Equipment Recommendations       Recommendations for Other Services       Precautions / Restrictions Precautions Precautions: Posterior Hip;Fall Precaution Booklet Issued: No Restrictions Weight Bearing Restrictions: Yes RLE Weight Bearing: Weight bearing as tolerated    Mobility  Bed Mobility Overal bed mobility: Needs Assistance Bed Mobility: Supine to Sit     Supine to sit: Min assist     General bed mobility comments: assist for sequencing and sliding B LE off bed. Cues given for using B UE and pushing up on elbow/hands in order to scoot out towards EOB.  Once seated, pt able to sit with supervision  Transfers Overall transfer level: Needs assistance Equipment used: Rolling walker (2 wheeled) Transfers: Sit to/from Stand Sit to Stand: Min assist         General transfer comment: transfers performed with RW and safe technique. Slight assist to weight shift to L LE in order to slide R LE under body and weight bear. Pt stands with  slightly forward flexed posture.  Ambulation/Gait Ambulation/Gait assistance: Min guard Ambulation Distance (Feet): 90 Feet Assistive device: Rolling walker (2 wheeled) Gait Pattern/deviations: Step-through pattern     General Gait Details: ambulated using rw and cga. Pt able to improve from step to gait pattern to reciprocal gait pattern. Pt demonstrates upright posture. 1 cue required for maintaining hip precautions during ambulation and performing R turns.   Stairs            Wheelchair Mobility    Modified Rankin (Stroke Patients Only)       Balance Overall balance assessment: Needs assistance Sitting-balance support: Feet supported Sitting balance-Leahy Scale: Good     Standing balance support: Bilateral upper extremity supported Standing balance-Leahy Scale: Good                      Cognition Arousal/Alertness: Awake/alert Behavior During Therapy: WFL for tasks assessed/performed Overall Cognitive Status: Within Functional Limits for tasks assessed                      Exercises Other Exercises Other Exercises: Supine ther-ex performed including R LE ankle pumps, glut sets, quad sets, SAQ, and hip ab/add. ALl ther-ex performed x 10 reps with min assist. Cues given for correct technique    General Comments        Pertinent Vitals/Pain Pain Assessment: 0-10 Pain Score: 4  Pain Location: R hip Pain Descriptors / Indicators: Operative site guarding Pain Intervention(s): Limited activity within  patient's tolerance;Repositioned    Home Living Family/patient expects to be discharged to:: Private residence Living Arrangements: Spouse/significant other Available Help at Discharge: Family;Available 24 hours/day Type of Home: House Home Access: Stairs to enter Entrance Stairs-Rails: None Home Layout: Two level;Able to live on main level with bedroom/bathroom Home Equipment: Dan HumphreysWalker - 2 wheels;Bedside commode;Cane - single  point;Crutches Additional Comments: Pt also has alternate entry with 4 stairs with 1 railing.     Prior Function Level of Independence: Independent      Comments: limited mobility prior secondary to pain   PT Goals (current goals can now be found in the care plan section) Acute Rehab PT Goals Patient Stated Goal: to go home PT Goal Formulation: With patient Time For Goal Achievement: 06/29/16 Potential to Achieve Goals: Good Additional Goals Additional Goal #1: Pt will be able to perform bed mobility/transfers with cga and RW in order to improve functional independence Progress towards PT goals: Progressing toward goals    Frequency    BID      PT Plan Current plan remains appropriate    Co-evaluation             End of Session Equipment Utilized During Treatment: Gait belt Activity Tolerance: Patient tolerated treatment well Patient left: in chair;with chair alarm set     Time: [id number] PT Time Calculation (min) (ACUTE ONLY): 31 min  Charges:  $Gait Training: 8-22 mins $Therapeutic Exercise: 8-22 mins                    G Codes:      Jose [name] 06/15/2016, 2:56 PM  Jose [name], PT, DPT [phone]

## 2016-06-15 NOTE — Progress Notes (Signed)
ORTHOPAEDICS PROGRESS NOTE  PATIENT NAME: Jose FrederickMac D Neilan DOB: January 23, 1941  MRN: [id number]  POD # 1: Right total hip arthroplasty  Subjective: The patient states that the pain is well-controlled. He denies any nausea. The patient dangled at the side of the bed last night and tolerated well.  Objective: Vital signs in last 24 hours: Temp:  [97.2 F (36.2 C)-98.8 F (37.1 C)] 97.8 F (36.6 C) (09/19 0432) Pulse Rate:  [59-76] 74 (09/19 0432) Resp:  [12-20] 20 (09/19 0432) BP: (106-164)/(53-81) 106/53 (09/19 0432) SpO2:  [96 %-100 %] 96 % (09/19 0432) Weight:  [106.3 kg (234 lb 5.6 oz)] 106.3 kg (234 lb 5.6 oz) (09/18 1628)  Intake/Output from previous day: 09/18 0701 - 09/19 0700 In: 4360 [P.O.:360; I.V.:3175; IV Piggyback:600] Out: 3430 [Urine:3060; Drains:270; Blood:100]   Recent Labs  06/15/16 0456  WBC 7.6  HGB 13.3  HCT 37.5*  PLT 132*  K 4.1  CL 100*  CO2 26  BUN 10  CREATININE 0.77  GLUCOSE 109*  CALCIUM 8.4*    EXAM General: Well-developed well-nourished male seen in no acute distress. Lungs: clear to auscultation Cardiac: normal rate, regular rhythm, normal S1, S2, no murmurs, rubs, clicks or gallops Right lower extremity: Right hip dressing is clean and dry. Hemovac drain is in place. No significant erythema, ecchymosis, or swelling to the right thigh. Homans test is negative. Neurologic: Awake, alert, and oriented. Sensory motor function are intact.  Assessment: Right total hip arthroplasty  Active Problems:   S/P total hip arthroplasty   Plan: Recheck labs in the morning. Today's goal were reviewed with the patient.  Begin physical therapy and occupational therapy as per total hip arthroplasty rehabilitation protocol. Aspirin was continued as per Dr. Windell HummingbirdGollan's recommendations. Plan is to go Home after hospital stay. DVT Prophylaxis - Aspirin, Lovenox, Foot Pumps and TED hose  Yannet Rincon P. Angie FavaHooten, Jr. M.D.

## 2016-06-16 LAB — BASIC METABOLIC PANEL
Anion gap: 5 (ref 5–15)
BUN: 10 mg/dL (ref 6–20)
CHLORIDE: 99 mmol/L — AB (ref 101–111)
CO2: 30 mmol/L (ref 22–32)
CREATININE: 0.74 mg/dL (ref 0.61–1.24)
Calcium: 8.6 mg/dL — ABNORMAL LOW (ref 8.9–10.3)
GFR calc Af Amer: >60 mL/min (ref >60)
GFR calc non Af Amer: >60 mL/min (ref >60)
GLUCOSE: 127 mg/dL — AB (ref 65–99)
Potassium: 4.9 mmol/L (ref 3.5–5.1)
Sodium: 134 mmol/L — ABNORMAL LOW (ref 135–145)

## 2016-06-16 LAB — CBC
HEMATOCRIT: 37.5 % — AB (ref 40.0–52.0)
HEMOGLOBIN: 13.2 g/dL (ref 13.0–18.0)
MCH: 34.1 pg — AB (ref 26.0–34.0)
MCHC: 35.3 g/dL (ref 32.0–36.0)
MCV: 96.4 fL (ref 80.0–100.0)
Platelets: 131 10*3/uL — ABNORMAL LOW (ref 150–440)
RBC: 3.88 MIL/uL — ABNORMAL LOW (ref 4.40–5.90)
RDW: 14.1 % (ref 11.5–14.5)
WBC: 8 10*3/uL (ref 3.8–10.6)

## 2016-06-16 LAB — SURGICAL PATHOLOGY

## 2016-06-16 NOTE — Progress Notes (Signed)
Physical Therapy Treatment Patient Details Name: Jose [name] MRN: [id number] DOB: 06-27-1941 Today's Date: 06/16/2016    History of Present Illness Jose [name] is a 75 y.o. year old male with a long history of progressive hip and groin  pain. X-rays demonstrated severe degenerative changes and underwent elective R THA by Dr Ernest Pine with posterior hip precautions and WBAT on 06/14/16.  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      PT Comments    Pt agreeable to PT; reports 5/10 right hip pain. Pt demonstrates verbal recall of 3 posterior hip precautions; however, requires re education and cues to apply and adhere to with function particularly flexion rule. Pt does demonstrate improved understanding and performance with cues. Pt progressing ambulation distance; initially ambulates with unequal, reciprocal pattern, but improved to more equal pattern with instruction. Pt participates well with lower extremity strengthening and range of motion exercises in chair. No increased pain during session. Pt encouraged to remain up in chair. Plan to see pt this afternoon for continued work on range, strength, posterior hip precaution safety with function and stair climbing to allow for safe return home.   Follow Up Recommendations  Home health PT     Equipment Recommendations       Recommendations for Other Services       Precautions / Restrictions Precautions Precautions: Posterior Hip;Fall Precaution Comments: Verbally remembers all 3; instruction/cues for applying with function Restrictions Weight Bearing Restrictions: Yes RLE Weight Bearing: Weight bearing as tolerated    Mobility  Bed Mobility               General bed mobility comments: Not tested; up in chair  Transfers Overall transfer level: Needs assistance Equipment used:  Rolling walker (2 wheeled) Transfers: Sit to/from Stand Sit to Stand: Min assist         General transfer comment: Education and cues with assisted weight shift to the left with stand and sit to adhere to posterior hip precautions  Ambulation/Gait Ambulation/Gait assistance: Min guard;Supervision Ambulation Distance (Feet): 275 Feet Assistive device: Rolling walker (2 wheeled) Gait Pattern/deviations: Step-through pattern;Decreased step length - left;Decreased stance time - right;Trunk flexed Gait velocity: reduced Gait velocity interpretation: Below normal speed for age/gender General Gait Details: Encouraged decreased step lenth right and increased left for more equal reciprocal pattern; pt improves inconsistently with instruction. Stiff leg pattern on right    Stairs            Wheelchair Mobility    Modified Rankin (Stroke Patients Only)       Balance Overall balance assessment: Needs assistance Sitting-balance support: Feet supported Sitting balance-Leahy Scale: Good Sitting balance - Comments: Verbal cues/instruction for adherence to posterior hip precautions   Standing balance support: Bilateral upper extremity supported Standing balance-Leahy Scale: Fair Standing balance comment: Decreased weight bear on RLE especially without rw                     Cognition Arousal/Alertness: Awake/alert Behavior During Therapy: WFL for tasks assessed/performed Overall Cognitive Status: Within Functional Limits for tasks assessed                      Exercises Total Joint Exercises Ankle Circles/Pumps: AROM;Both;20 reps (long sit) Quad Sets: Strengthening;Both;20 reps Gluteal Sets: Strengthening;Both;20 reps Hip ABduction/ADduction: AAROM;Right;20 reps Long Arc Quad: AAROM;Right;20  reps;Seated    General Comments        Pertinent Vitals/Pain Pain Assessment: 0-10 Pain Score: 5  Pain Location: R hip Pain Descriptors / Indicators:  Aching;Operative site guarding Pain Intervention(s): Monitored during session;Premedicated before session    Home Living                      Prior Function            PT Goals (current goals can now be found in the care plan section) Progress towards PT goals: Progressing toward goals    Frequency    BID      PT Plan Current plan remains appropriate    Co-evaluation             End of Session Equipment Utilized During Treatment: Gait belt Activity Tolerance: Patient tolerated treatment well;No increased pain Patient left: in chair;with call bell/phone within reach;with chair alarm set;with family/visitor present     Time: 1030-1059 PT Time Calculation (min) (ACUTE ONLY): 29 min  Charges:  $Gait Training: 8-22 mins $Therapeutic Exercise: 8-22 mins                    G Codes:      Scot DockHeidi E Barnes, PTA 06/16/2016, 12:07 PM

## 2016-06-16 NOTE — Progress Notes (Signed)
   Subjective: 2 Days Post-Op Procedure(s) (LRB): TOTAL HIP ARTHROPLASTY (Right) Patient reports pain as 6 on 0-10 scale.   Patient is well, and has had no acute complaints or problems Continue with physical therapy today.  Plan is to go Home after hospital stay. no nausea and no vomiting Patient denies any chest pains or shortness of breath. Objective: Vital signs in last 24 hours: Temp:  [98.2 F (36.8 C)-99.5 F (37.5 C)] 98.6 F (37 C) (09/20 0749) Pulse Rate:  [67-89] 78 (09/20 0749) Resp:  [18] 18 (09/20 0749) BP: (118-142)/(50-74) 128/60 (09/20 0749) SpO2:  [92 %-98 %] 97 % (09/20 0749) well approximated incision Heels are non tender and elevated off the bed using rolled towels Intake/Output from previous day: 09/19 0701 - 09/20 0700 In: 480 [P.O.:480] Out: 3410 [Urine:3100; Drains:310] Intake/Output this shift: Total I/O In: 240 [P.O.:240] Out: -    Recent Labs  06/15/16 0456 06/16/16 0506  HGB 13.3 13.2    Recent Labs  06/15/16 0456 06/16/16 0506  WBC 7.6 8.0  RBC 3.87* 3.88*  HCT 37.5* 37.5*  PLT 132* 131*    Recent Labs  06/15/16 0456 06/16/16 0506  NA 133* 134*  K 4.1 4.9  CL 100* 99*  CO2 26 30  BUN 10 10  CREATININE 0.77 0.74  GLUCOSE 109* 127*  CALCIUM 8.4* 8.6*   No results for input(s): LABPT, INR in the last 72 hours.  EXAM General - Patient is Alert, Appropriate and Oriented Extremity - Neurologically intact Neurovascular intact Sensation intact distally Intact pulses distally Dorsiflexion/Plantar flexion intact Dressing - dressing C/D/I Motor Function - intact, moving foot and toes well on exam.    Past Medical History:  Diagnosis Date  . Coronary artery disease 2002   RCA stent, 2002. 50-60% stenosis of proximal LAD, 75% stenosis OM2, patent stent mid RCA by cardiac catheterization, 04/17/07  . Headache    hx of migraines - went away in his 40's  . 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  Hypercholesteremia   . Hypertension   . Hypothyroidism   . MI (myocardial infarction) (HCC)   . Pacemaker     Assessment/Plan: 2 Days Post-Op Procedure(s) (LRB): TOTAL HIP ARTHROPLASTY (Right) Active Problems:   S/P total hip arthroplasty  Estimated body mass index is 30.92 kg/m as calculated from the following:   Height as of this encounter: 6\' 1"  (1.854 m).   Weight as of this encounter: 106.3 kg (234 lb 5.6 oz). Up with therapy Plan for discharge tomorrow Discharge home with home health  Labs: reviewed DVT Prophylaxis - Aspirin, Lovenox, Foot Pumps and TED hose Weight-Bearing as tolerated to right leg Hemovac discontinued. Pt will go home on 2 weeks of lovenox  Deniel Mcquiston R. [employer]fe PA [facility] 06/16/2016, 11:08 AM

## 2016-06-16 NOTE — Progress Notes (Signed)
Physical Therapy Treatment Patient Details Name: Jose [name] MRN: [id number] DOB: Mar 31, 1941 Today's Date: 06/16/2016    History of Present Illness Jose [name] is a 75 y.o. year old male with a long history of progressive hip and groin  pain. X-rays demonstrated severe degenerative changes and underwent elective R THA by Dr Ernest Pine with posterior hip precautions and WBAT on 06/14/16.  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      PT Comments    Agreeable to PT this afternoon. Continues to progress ambulation distance and quality with cues. Demonstrated understanding an performance of stair climbing without issue. Continues to require verbal cueing for adherence to posterior hip precautions with functional mobility. Continue PT to progress range, strength and safety for an optimal, safe discharge home.   Follow Up Recommendations  Home health PT     Equipment Recommendations       Recommendations for Other Services       Precautions / Restrictions Precautions Precautions: Posterior Hip;Fall Precaution Comments: Verbally remembers all 3; instruction/cues for applying with function Restrictions Weight Bearing Restrictions: Yes RLE Weight Bearing: Weight bearing as tolerated    Mobility  Bed Mobility Overal bed mobility: Needs Assistance Bed Mobility: Supine to Sit     Supine to sit: Supervision     General bed mobility comments: Cues for adhering to posterior precautions  Transfers Overall transfer level: Needs assistance Equipment used: Rolling walker (2 wheeled) Transfers: Sit to/from Stand Sit to Stand: Min assist         General transfer comment: Cues for adhering to posterior precautions. Min A to steady with L wt shift until standing upright  Ambulation/Gait Ambulation/Gait assistance: Supervision;Min  guard Ambulation Distance (Feet): 290 Feet Assistive device: Rolling walker (2 wheeled) Gait Pattern/deviations: Step-through pattern Gait velocity: reduced Gait velocity interpretation: <1.8 ft/sec, indicative of risk for recurrent falls (1.67 ft/sec) General Gait Details: Continued cues for equal step lengths; improves with continued instruction. Continues with stiff RLE with minimal R knee flexion   Stairs Stairs: Yes Stairs assistance: Min guard Stair Management: Two rails;Step to pattern;Forwards Number of Stairs: 4 General stair comments: Good sequence and safety  Wheelchair Mobility    Modified Rankin (Stroke Patients Only)       Balance Overall balance assessment: Needs assistance Sitting-balance support: Feet supported Sitting balance-Leahy Scale: Good Sitting balance - Comments: Verbal cues/instruction for adherence to posterior hip precautions   Standing balance support: Bilateral upper extremity supported Standing balance-Leahy Scale: Fair Standing balance comment: Decreased weight bear on RLE especially without rw                     Cognition Arousal/Alertness: Awake/alert Behavior During Therapy: WFL for tasks assessed/performed Overall Cognitive Status: Within Functional Limits for tasks assessed                      Exercises Total Joint Exercises Ankle Circles/Pumps: AROM;Both;20 reps (long sit) Quad Sets: Strengthening;Both;20 reps Gluteal Sets: Strengthening;Both;20 reps Hip ABduction/ADduction: AAROM;Right;20 reps Long Arc Quad: AAROM;Right;20 reps;Seated    General Comments        Pertinent Vitals/Pain Pain Assessment: 0-10 Pain Score: 5  Pain Location: R hip Pain Descriptors / Indicators: Aching;Operative site guarding Pain Intervention(s): Premedicated before session;Monitored during session    Home Living  Prior Function            PT Goals (current goals can now be found in the care  plan section) Progress towards PT goals: Progressing toward goals    Frequency    BID      PT Plan Current plan remains appropriate    Co-evaluation             End of Session Equipment Utilized During Treatment: Gait belt Activity Tolerance: Patient tolerated treatment well;No increased pain Patient left: with call bell/phone within reach;with family/visitor present;in bed (refused alarm)     Time: [id number] PT Time Calculation (min) (ACUTE ONLY): 27 min  Charges:  $Gait Training: 23-37 mins $Therapeutic Exercise: 8-22 mins                    G Codes:      Scot DockHeidi E Barnes 06/16/2016, 3:07 PM

## 2016-06-17 NOTE — Progress Notes (Signed)
Physical Therapy Treatment Patient Details Name: Jose [name] MRN: [id number] DOB: 10/21/1940 Today's Date: 06/17/2016    History of Present Illness [name] is a 75 y.o. year old male with a long history of progressive hip and groin  pain. X-rays demonstrated severe degenerative changes and underwent elective R THA by Dr Ernest Pine with posterior hip precautions and WBAT on 06/14/16.  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      PT Comments    Pt agreeable to PT; reports right hip pain 4/10 post medication. Pt demonstrates Mod I with bed mobility and sit to/from stand transfers. Ambulates with supervision today with improved quality regarding step lengths (equal), greater right hip/knee flexion on swing phase, and speed/fluidity. Pt participates in right lower extremity stand exercises and pt/spouse educated with written program provided. Pt with current discharge home today with home health PT.   Follow Up Recommendations  Home health PT     Equipment Recommendations       Recommendations for Other Services       Precautions / Restrictions Precautions Precautions: Posterior Hip;Fall Precaution Comments: much improved adherence to precautions with function this morn Restrictions Weight Bearing Restrictions: Yes RLE Weight Bearing: Weight bearing as tolerated    Mobility  Bed Mobility Overal bed mobility: Modified Independent             General bed mobility comments: Good adherence to post;r hip precautions in/out of bed. Increased time to manage RLE  Transfers Overall transfer level: Modified independent Equipment used: Rolling walker (2 wheeled) Transfers: Sit to/from Stand Sit to Stand: Modified independent (Device/Increase time)         General transfer comment: Demonstrates good adherence to posterior  precautions; bed elevated to mimic height at home. improved steadiness with transition. Does not require physical assisst  Ambulation/Gait Ambulation/Gait assistance: Supervision Ambulation Distance (Feet): 260 Feet Assistive device: Rolling walker (2 wheeled) Gait Pattern/deviations: Step-through pattern;Antalgic (mild antalgia) Gait velocity: reduced Gait velocity interpretation: Below normal speed for age/gender General Gait Details: Improved step lengths to equal. Improved fluidity and hip/knee flexion with swing phase on R   Stairs            Wheelchair Mobility    Modified Rankin (Stroke Patients Only)       Balance Overall balance assessment: Modified Independent             Standing balance comment: Demonstrates ability to stand without UE support                    Cognition Arousal/Alertness: Awake/alert Behavior During Therapy: WFL for tasks assessed/performed Overall Cognitive Status: Within Functional Limits for tasks assessed                      Exercises Total Joint Exercises Hip ABduction/ADduction: Strengthening;Right;10 reps;Standing Straight Leg Raises: Strengthening;Right;10 reps;Standing Long Arc Quad: AROM;Right;10 reps;Seated;Strengthening Knee Flexion: Strengthening;Right;10 reps;Standing Marching in Standing: Strengthening;Right;10 reps;Standing Standing Hip Extension: Strengthening;Right;10 reps;Standing Other Exercises Other Exercises: mini squat 10x     General Comments        Pertinent Vitals/Pain Pain Assessment: 0-10 Pain Score: 4  Pain Location: R hip Pain Descriptors / Indicators: Aching;Constant Pain Intervention(s): Monitored during session;Premedicated before session    Home Living  Prior Function            PT Goals (current goals can now be found in the care plan section) Progress towards PT goals: Progressing toward goals    Frequency    BID      PT  Plan Current plan remains appropriate    Co-evaluation             End of Session Equipment Utilized During Treatment: Gait belt Activity Tolerance: Patient tolerated treatment well Patient left: in bed;with call bell/phone within reach;with family/visitor present (refused alarm)     Time: [id number] PT Time Calculation (min) (ACUTE ONLY): 31 min  Charges:  $Gait Training: 8-22 mins $Therapeutic Exercise: 8-22 mins                    G CodesScot Dock:      Chaney Maclaren E Mkenzie Dotts, PTA 06/17/2016, 10:23 AM

## 2016-06-17 NOTE — Progress Notes (Signed)
Merlinda FrederickMac D Macdonnell to be D/C'd Home per MD order.  Discussed with the patient and all questions fully answered.  VSS, Skin clean, dry and intact without evidence of skin break down, no evidence of skin tears noted. IV catheter discontinued intact. Site without signs and symptoms of complications. Dressing and pressure applied.  An After Visit Summary was printed and given to the patient. Patient received prescription.  D/c education completed with patient/family including follow up instructions, medication list, d/c activities limitations if indicated, with other d/c instructions as indicated by MD - patient able to verbalize understanding, all questions fully answered.   Patient instructed to return to ED, call 911, or call MD for any changes in condition.   Patient escorted via WC, and D/C home via private auto.  Harvie HeckMelanie Danilynn Jemison 06/17/2016 3:05 PM

## 2016-06-17 NOTE — Care Management (Signed)
[facility] [address]- Called Lovenox 40 mg SQ #14, no refills.

## 2016-06-17 NOTE — Progress Notes (Signed)
   Subjective: 3 Days Post-Op Procedure(s) (LRB): TOTAL HIP ARTHROPLASTY (Right) Patient reports pain as mild.   Patient is well, and has had no acute complaints or problems Continue with physical therapy today.  Plan is to go Home after hospital stay. no nausea and no vomiting Patient denies any chest pains or shortness of breath. Objective: Vital signs in last 24 hours: Temp:  [98.2 F (36.8 C)-98.7 F (37.1 C)] 98.5 F (36.9 C) (09/21 0358) Pulse Rate:  [68-79] 68 (09/21 0358) Resp:  [16-18] 18 (09/21 0358) BP: (108-128)/(47-76) 108/47 (09/21 0358) SpO2:  [94 %-100 %] 96 % (09/21 0358) well approximated incision Heels are non tender and elevated off the bed using rolled towels Intake/Output from previous day: 09/20 0701 - 09/21 0700 In: 480 [P.O.:480] Out: 1800 [Urine:1800] Intake/Output this shift: No intake/output data recorded.   Recent Labs  06/15/16 0456 06/16/16 0506  HGB 13.3 13.2    Recent Labs  06/15/16 0456 06/16/16 0506  WBC 7.6 8.0  RBC 3.87* 3.88*  HCT 37.5* 37.5*  PLT 132* 131*    Recent Labs  06/15/16 0456 06/16/16 0506  NA 133* 134*  K 4.1 4.9  CL 100* 99*  CO2 26 30  BUN 10 10  CREATININE 0.77 0.74  GLUCOSE 109* 127*  CALCIUM 8.4* 8.6*   No results for input(s): LABPT, INR in the last 72 hours.  EXAM General - Patient is Alert, Appropriate and Oriented Extremity - Neurologically intact Neurovascular intact Sensation intact distally Intact pulses distally Dorsiflexion/Plantar flexion intact Dressing - dressing C/D/I Motor Function - intact, moving foot and toes well on exam.    Past Medical History:  Diagnosis Date  . Coronary artery disease 2002   RCA stent, 2002. 50-60% stenosis of proximal LAD, 75% stenosis OM2, patent stent mid RCA by cardiac catheterization, 04/17/07  . Headache    hx of migraines - went away in his 40's  . 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 Hypercholesteremia   .  Hypertension   . Hypothyroidism   . MI (myocardial infarction) (HCC)   . Pacemaker     Assessment/Plan: 3 Days Post-Op Procedure(s) (LRB): TOTAL HIP ARTHROPLASTY (Right) Active Problems:   S/P total hip arthroplasty  Estimated body mass index is 30.92 kg/m as calculated from the following:   Height as of this encounter: 6\' 1"  (1.854 m).   Weight as of this encounter: 106.3 kg (234 lb 5.6 oz). Up with therapy Discharge home with home health  Labs: none  DVT Prophylaxis - Lovenox, Foot Pumps and TED hose Weight-Bearing as tolerated to right leg    Jon R. [facility]fe PA [facility] 06/17/2016, 7:34 AM

## 2016-08-19 IMAGING — CR DG CHEST 2V
2 series · 2 of 2 positions shown · non-contrast
Comparison: CT scan of the chest March 18, 2015.

CLINICAL DATA: Preoperative exam prior to CABG; history of previous
MI with stent placement ; history of right upper lobe pulmonary
nodule

EXAM:
CHEST  2 VIEW

[w chest pa]
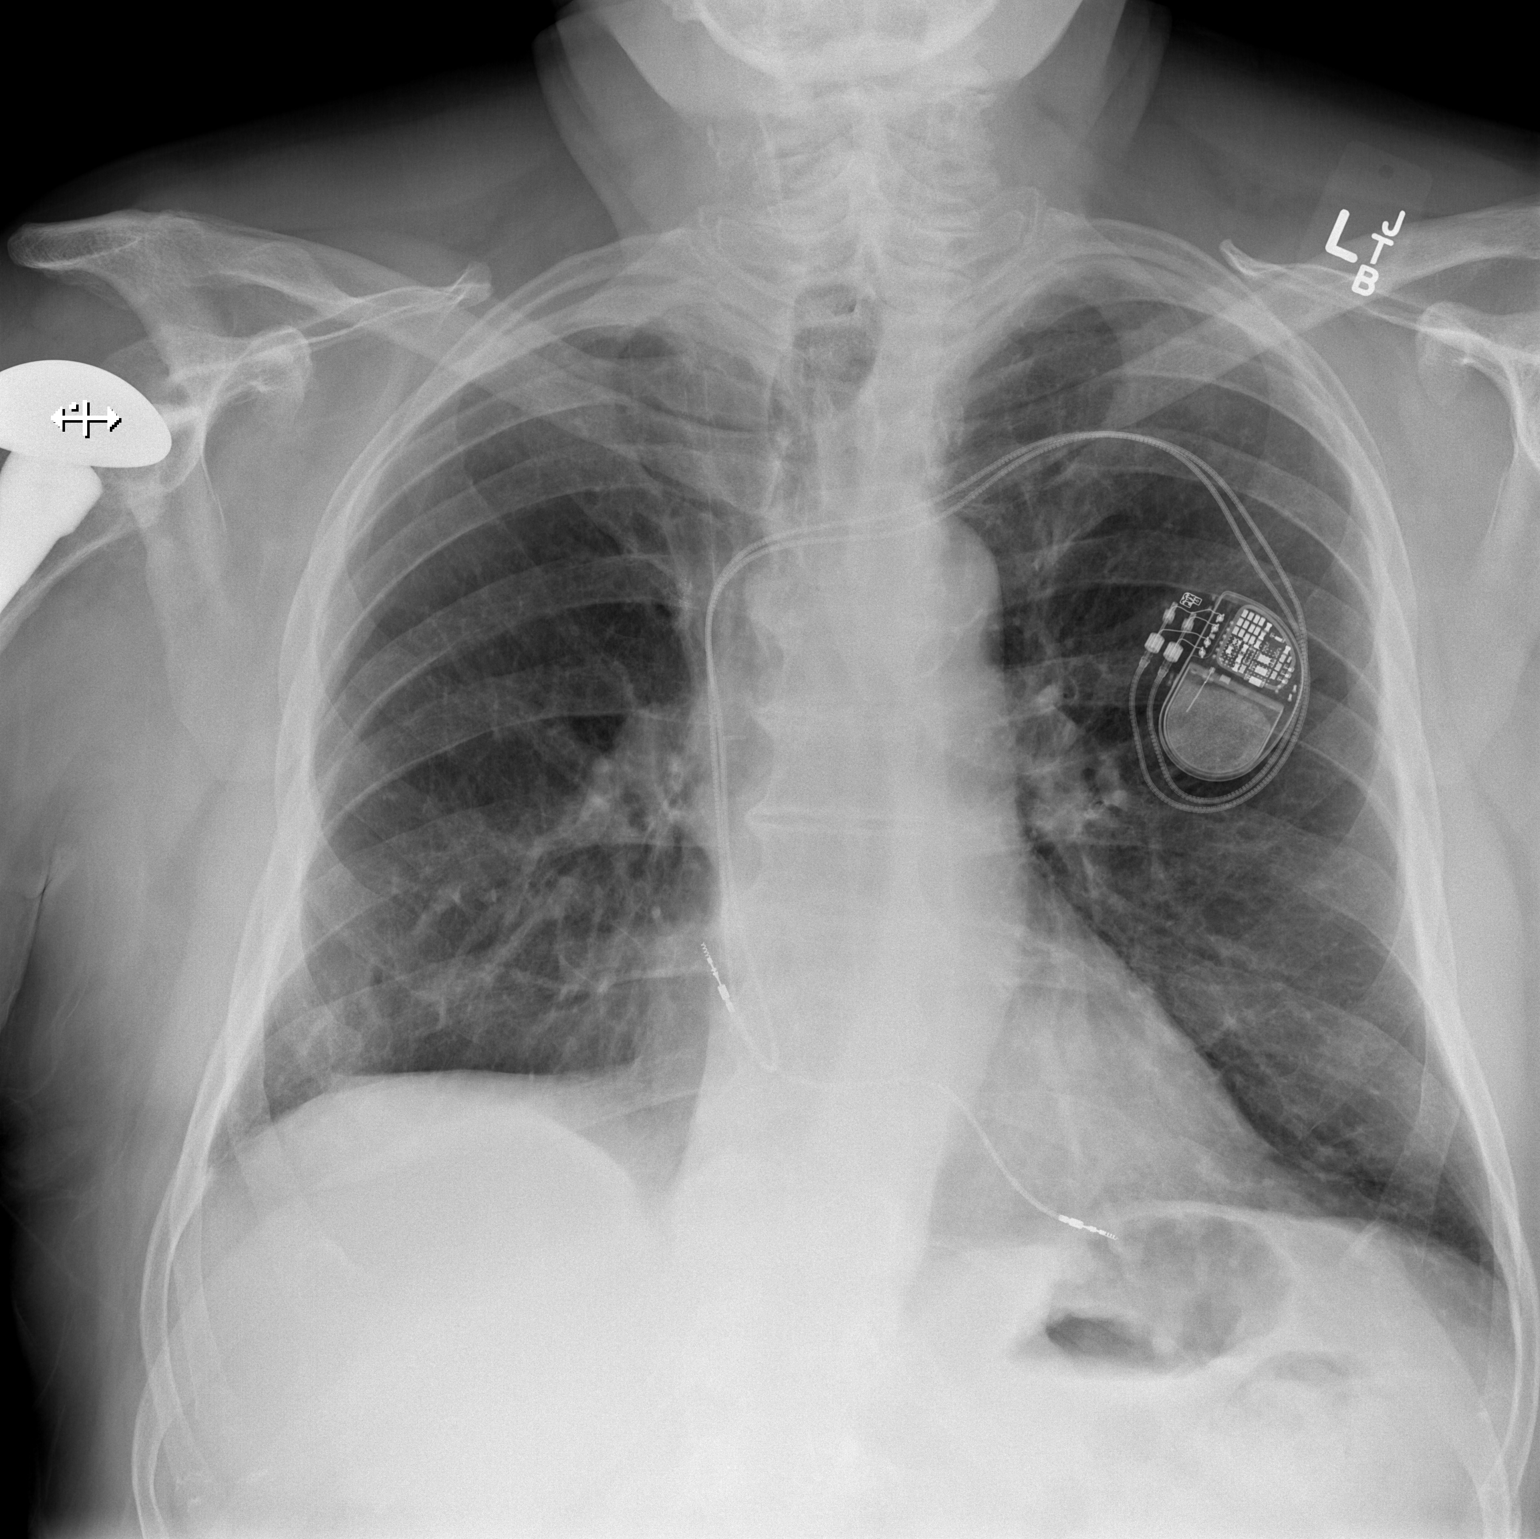

[w chest lat]
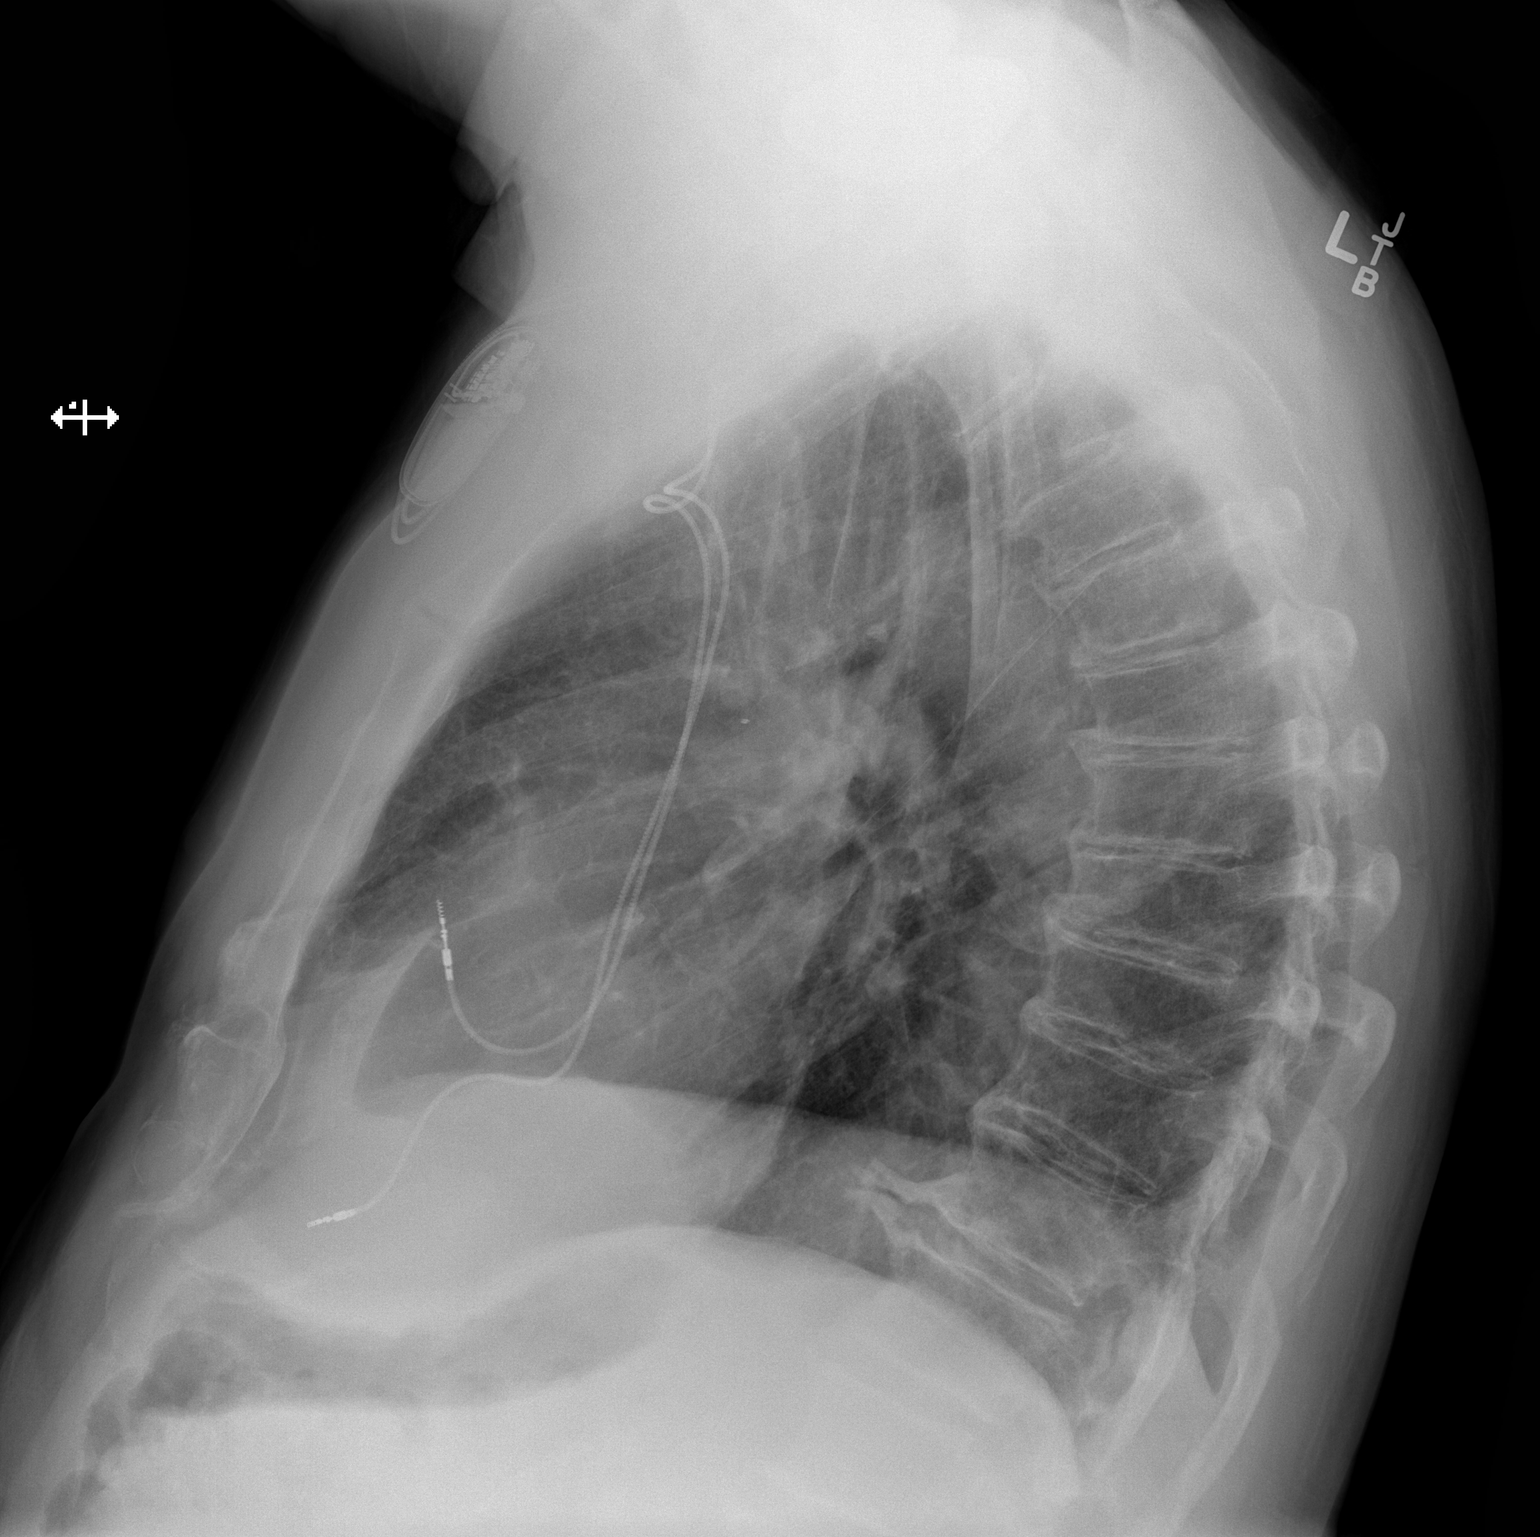

[2 of 2 positions shown; findings below may reference images not displayed]

FINDINGS: The lungs are adequately inflated. There is no focal infiltrate. The
interstitial markings are coarse in the lower lobes. The heart and
pulmonary vascularity are normal. The mediastinum is normal in
width. There is apical pleural thickening on the right which is not
a new finding. The permanent pacemaker is in reasonable position.
There is mild tortuosity of the descending thoracic aorta. There is
multilevel moderate degenerative disc disease of the thoracic spine
with calcification of portions of the anterior longitudinal
ligament. There is a prosthetic left shoulder joint.
IMPRESSION: Chronic bronchitic changes. There is no pneumonia, CHF, nor other
acute cardiopulmonary disease.

## 2016-08-25 IMAGING — CR DG CHEST 1V PORT
1 series · 1 of 1 positions shown · non-contrast
Comparison: 09/23/2015

CLINICAL DATA: Status post CABG

EXAM:
PORTABLE CHEST 1 VIEW

[AP]
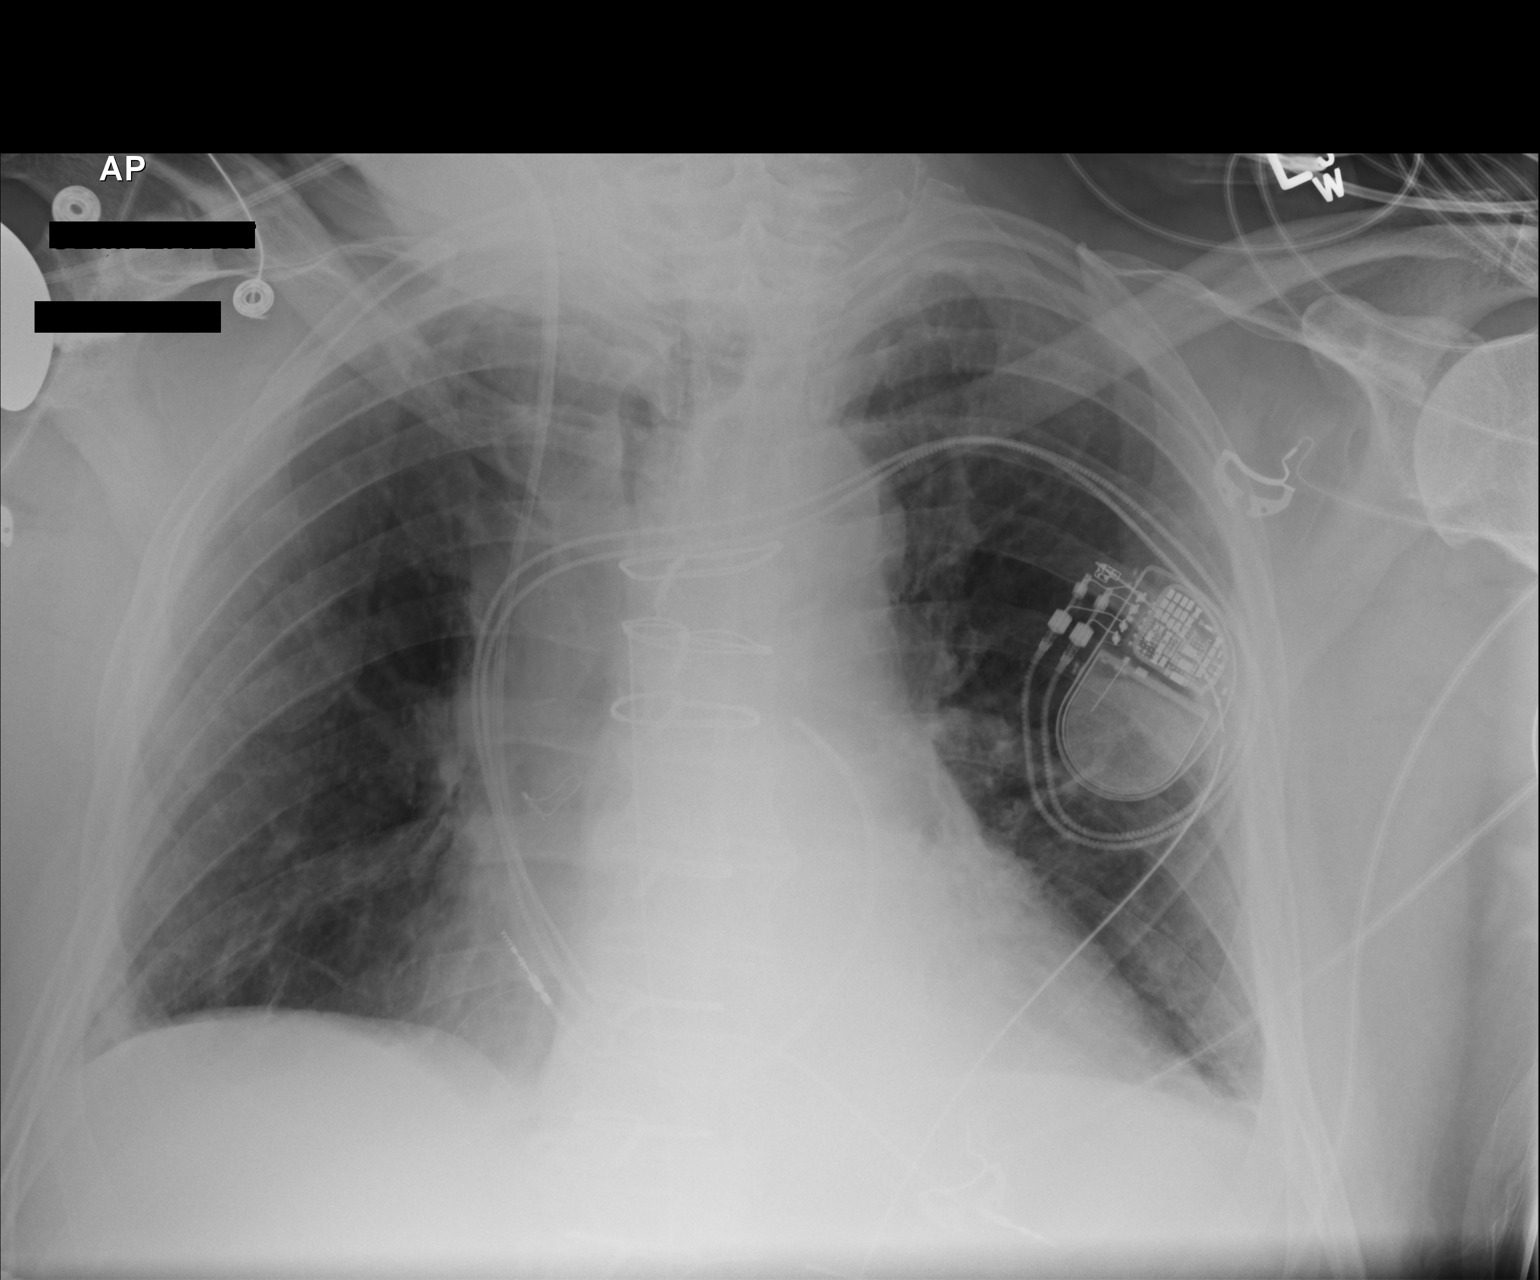

[1 of 1 positions shown; findings below may reference images not displayed]

FINDINGS: Interval extubation.

Right IJ Swan-Ganz catheter terminates in the main pulmonary artery.

Left chest tube and mediastinal drain.  No pneumothorax is seen.

Interval removal of enteric tube.

Cardiomegaly.  Postsurgical changes related to CABG.

Left subclavian pacemaker.
IMPRESSION: Interval extubation and removal of enteric tube.

Left chest tube and mediastinal drain.  No pneumothorax is seen.

Right IJ Swan-Ganz catheter terminates in the main pulmonary artery.

## 2016-09-08 ENCOUNTER — Telehealth: Payer: Self-pay | Admitting: Internal Medicine

## 2016-09-08 NOTE — Telephone Encounter (Signed)
Attempted to schedule fu from recall for patient to see Graciela HusbandsKlein and check device.  Per spouse patient is going to [place] in January and staying until sometime in march.  Patient wife wants to know if he can have his device remote checked and then fu sometime in march when he gets back.     Please call to let spouse know if this is ok and directions for remote entry.

## 2016-09-08 NOTE — Telephone Encounter (Signed)
Routing to Micron TechnologyHeather McGhee, Charity fundraiserN.

## 2016-09-09 ENCOUNTER — Telehealth: Payer: Self-pay

## 2016-09-09 NOTE — Telephone Encounter (Signed)
Pt called back, informed pt that he would need to see Dr. Graciela HusbandsKlein before [place] goes to [place] since he is overdue to see him. Pt requested [place] office, informed pt that there is no availability before Jan. 6 and he would need to come to the [address] office. Informed pt that a scheduler would be calling to to set up the apt, most likely on Jan 3. Pt voiced understanding.

## 2016-09-09 NOTE — Telephone Encounter (Signed)
LVM to schedule apt with SK in Jan.

## 2016-09-09 NOTE — Telephone Encounter (Addendum)
Routed to [facility] 

## 2016-09-14 ENCOUNTER — Encounter: Payer: Self-pay | Admitting: Internal Medicine

## 2016-09-14 ENCOUNTER — Ambulatory Visit (INDEPENDENT_AMBULATORY_CARE_PROVIDER_SITE_OTHER): Payer: Medicare Other | Admitting: Internal Medicine

## 2016-09-14 VITALS — BP 110/62 | HR 78 | Ht 73.0 in | Wt 249.5 lb

## 2016-09-14 DIAGNOSIS — I25810 Atherosclerosis of coronary artery bypass graft(s) without angina pectoris: Secondary | ICD-10-CM

## 2016-09-14 DIAGNOSIS — I480 Paroxysmal atrial fibrillation: Secondary | ICD-10-CM

## 2016-09-14 DIAGNOSIS — I441 Atrioventricular block, second degree: Secondary | ICD-10-CM

## 2016-09-14 DIAGNOSIS — Z950 Presence of cardiac pacemaker: Secondary | ICD-10-CM

## 2016-09-14 LAB — CUP PACEART INCLINIC DEVICE CHECK
Battery Remaining Longevity: 90 mo
Battery Voltage: 3.01 V
Brady Statistic AS VS Percent: 0.64 %
Brady Statistic RA Percent Paced: 37.11 %
Brady Statistic RV Percent Paced: 99.08 %
Date Time Interrogation Session: 20171219143621
Implantable Lead Implant Date: 20160531
Implantable Lead Location: 753860
Implantable Pulse Generator Implant Date: 20160531
Lead Channel Impedance Value: 456 Ohm
Lead Channel Pacing Threshold Amplitude: 1.125 V
Lead Channel Pacing Threshold Pulse Width: 0.4 ms
Lead Channel Sensing Intrinsic Amplitude: 1.25 mV
Lead Channel Setting Pacing Amplitude: 2.25 V
Lead Channel Setting Sensing Sensitivity: 1.2 mV
MDC IDC LEAD IMPLANT DT: 20160531
MDC IDC LEAD LOCATION: 753859
MDC IDC MSMT LEADCHNL RA IMPEDANCE VALUE: 266 Ohm
MDC IDC MSMT LEADCHNL RA IMPEDANCE VALUE: 380 Ohm
MDC IDC MSMT LEADCHNL RA SENSING INTR AMPL: 1.375 mV
MDC IDC MSMT LEADCHNL RV IMPEDANCE VALUE: 399 Ohm
MDC IDC MSMT LEADCHNL RV PACING THRESHOLD AMPLITUDE: 0.625 V
MDC IDC MSMT LEADCHNL RV PACING THRESHOLD PULSEWIDTH: 0.4 ms
MDC IDC MSMT LEADCHNL RV SENSING INTR AMPL: 5.875 mV
MDC IDC MSMT LEADCHNL RV SENSING INTR AMPL: 5.875 mV
MDC IDC SET LEADCHNL RV PACING AMPLITUDE: 2 V
MDC IDC SET LEADCHNL RV PACING PULSEWIDTH: 0.4 ms
MDC IDC STAT BRADY AP VP PERCENT: 37.16 %
MDC IDC STAT BRADY AP VS PERCENT: 0.1 %
MDC IDC STAT BRADY AS VP PERCENT: 62.09 %

## 2016-09-14 NOTE — Progress Notes (Signed)
Patient Care Team: Danella PentonMark F Miller, MD as PCP - General (Internal Medicine)   HPI  Jose [name] is a 75 y.o. 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 He has a history of coronary artery disease with prior bypass surgery 12/16 EF at that time was 40-45%. These records were reviewed. This occurred outside of the context of ACS.  Denies chest pain or shortness of breath. Records and Results Reviewedv  Past Medical History:  Diagnosis Date  . Coronary artery disease 2002   RCA stent, 2002. 50-60% stenosis of proximal LAD, 75% stenosis OM2, patent stent mid RCA by cardiac catheterization, 04/17/07  . Headache    hx of migraines - went away in his 40's  . 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 Hypercholesteremia   . Hypertension   . Hypothyroidism   . MI (myocardial infarction)   . Pacemaker     Past Surgical History:  Procedure Laterality Date  . CARDIAC CATHETERIZATION    . CARDIAC CATHETERIZATION Left 09/12/2015   Procedure: Left Heart Cath and Coronary Angiography;  Surgeon: Antonieta Ibaimothy J Gollan, MD;  Location: [facility] INVASIVE CV LAB;  Service: Cardiovascular;  Laterality: Left;  . CARPAL TUNNEL RELEASE Bilateral   . CORONARY ANGIOPLASTY WITH STENT PLACEMENT    . CORONARY ARTERY BYPASS GRAFT N/A 09/23/2015   Procedure: CORONARY ARTERY BYPASS GRAFTING (CABG), ON PUMP, TIMES FOUR, USING LEFT INTERNAL MAMMARY ARTERY, RIGHT GREATER SAPHENOUS VEIN HARVESTED ENDOSCOPICALLY;  Surgeon: Alleen BorneBryan K Bartle, MD;  Location: [facility] OR;  Service: Open Heart Surgery;  Laterality: N/A;  . HAMMER TOE SURGERY    . INSERT / REPLACE / REMOVE PACEMAKER  02/25/2015   Serial # [id number] H Model # A2DR01/ DDD  . LOBECTOMY     upper right   . SHOULDER SURGERY Right    replacement  . TEE WITHOUT CARDIOVERSION N/A 09/23/2015   Procedure: TRANSESOPHAGEAL ECHOCARDIOGRAM (TEE);  Surgeon: Alleen BorneBryan K Bartle, MD;  [facility];   Service: Open Heart Surgery;  Laterality: N/A;  . TOTAL HIP ARTHROPLASTY Right 06/14/2016   Procedure: TOTAL HIP ARTHROPLASTY;  Surgeon: Donato HeinzJames P Hooten, MD;  Location: [facility] ORS;  Service: Orthopedics;  Laterality: Right;  . TOTAL KNEE ARTHROPLASTY     right    Current Outpatient Prescriptions  Medication Sig Dispense Refill  . aspirin 81 MG EC tablet Take 1 tablet (81 mg total) by mouth daily.    Marland Kitchen. levothyroxine (SYNTHROID, LEVOTHROID) 25 MCG tablet Take 25 mcg by mouth daily before breakfast.     . meloxicam (MOBIC) 15 MG tablet Take 15 mg by mouth daily.    . metoprolol tartrate (LOPRESSOR) 50 MG tablet Take 1 tablet (50 mg total) by mouth 2 (two) times daily. 180 tablet 3  . Multiple Vitamins-Minerals (MULTIVITAMIN WITH MINERALS) tablet Take 1 tablet by mouth daily.    . simvastatin (ZOCOR) 40 MG tablet Take 1 tablet (40 mg total) by mouth daily at 6 PM. 90 tablet 3  . ticagrelor (BRILINTA) 90 MG TABS tablet Take 1 tablet (90 mg total) by mouth 2 (two) times daily. 180 tablet 3   No current facility-administered medications for this visit.     Allergies  Allergen Reactions  . Amoxicillin Nausea Only  . Amoxicillin-Pot Clavulanate Other (See Comments)    Patient reports he took oral Augmentin after a foot surgery, and it caused severe nausea; however reports that he can take '  plain' amoxicillin; he takes amoxicillin prior to any dentist procedures, and has on hand      Review of Systems negative except from HPI and PMH  Physical Exam BP 110/62 (BP Location: Left Arm, Patient Position: Sitting, Cuff Size: Normal)   Pulse 78   Ht 6\' 1"  (1.854 m)   Wt 249 lb 8 oz (113.2 kg)   BMI 32.92 kg/m  Well developed and well nourished in no acute distress HENT normal E scleral and icterus clear Neck Supple JVP flat; carotids brisk and full Clear to ausculation Device pocket well healed; without hematoma or erythema.  There is no tethering  Regular rate and rhythm, no murmurs gallops  or rub Soft with active bowel sounds No clubbing cyanosis  Edema Alert and oriented, grossly normal motor and sensory function Skin Warm and Dry  ECG demonstrates AV pacing  Assessment and  Plan  Symptomatic heart block-Mobitz 1  Pacemaker-Medtronic  Coronary artery disease with prior stenting and interval bypass surgery  Atrial fibrillation-nonsustained \ No intercurrent atrial fibrillation or flutter  Dr. Knute NeuG is not around. I reviewed the data with CB.  Apparently there is not strong data in support of antiplatelet therapy long after bypass he did patients who have ACS. In the context of patients who do not have ACS there is not strong data for dual antiplatelet therapy following bypass,. For now we'll have him stop his Brilinta. He will continue his aspirin.  These recommendations were reviewed in the context of the guidelines and up-to-date and  are concordant  We will reassess LV function both in the context of interval revascularization as well as 100% ventricular pacing and a concern for pacemaker-induced cardiomyopathy    Current medicines are reviewed at length with the patient today .  The patient does have concerns regarding medicines.

## 2016-09-14 NOTE — Patient Instructions (Addendum)
Medication Instructions: - Your physician has recommended you make the following change in your medication:  1) Stop Brilinta  Labwork: - none ordered  Procedures/Testing: - Your physician has requested that you have an echocardiogram- late March. Echocardiography is a painless test that uses sound waves to create images of your heart. It provides your doctor with information about the size and shape of your heart and how well your heart's chambers and valves are working. This procedure takes approximately one hour. There are no restrictions for this procedure.  Follow-Up: - Remote monitoring is used to monitor your Pacemaker of ICD from home. This monitoring reduces the number of office visits required to check your device to one time per year. It allows us to keep an eye on the functioning of your device to ensure it is working properly. You are scheduled for a device check from home on 12/14/16. You may send your transmission at any time that day. If you have a wireless device, the transmission will be sent automatically. After your physician reviews your transmission, you will receive a postcard with your next transmission date.  - Your physician wants you to follow-up in: 1 year with Dr. Graciela HusbandsKlein. You will receive a reminder letter in the mail two months in advance. If you don't receive a letter, please call our office to schedule the follow-up appointment.  Any Additional Special Instructions Will Be Listed Below (If Applicable).     If you need a refill on your cardiac medications before your next appointment, please call your pharmacy.

## 2016-11-10 ENCOUNTER — Other Ambulatory Visit: Payer: Self-pay | Admitting: Cardiovascular Disease

## 2016-11-30 ENCOUNTER — Other Ambulatory Visit: Payer: Medicare Other

## 2016-12-14 ENCOUNTER — Encounter: Payer: Medicare Other | Admitting: *Deleted

## 2016-12-16 ENCOUNTER — Other Ambulatory Visit: Payer: Self-pay

## 2016-12-16 ENCOUNTER — Ambulatory Visit (INDEPENDENT_AMBULATORY_CARE_PROVIDER_SITE_OTHER): Payer: Medicare Other

## 2016-12-16 DIAGNOSIS — I25810 Atherosclerosis of coronary artery bypass graft(s) without angina pectoris: Secondary | ICD-10-CM | POA: Diagnosis not present

## 2016-12-18 ENCOUNTER — Other Ambulatory Visit: Payer: Self-pay | Admitting: Cardiovascular Disease

## 2016-12-20 NOTE — Telephone Encounter (Signed)
Pt needs f/u appt with Gollan. Thanks 

## 2016-12-29 ENCOUNTER — Encounter: Payer: Medicare Other | Admitting: *Deleted

## 2016-12-29 ENCOUNTER — Telehealth: Payer: Self-pay | Admitting: Cardiology

## 2016-12-29 NOTE — Telephone Encounter (Signed)
Spoke with pt and reminded pt of remote transmission that is due today. Pt verbalized understanding.   

## 2016-12-31 ENCOUNTER — Encounter: Payer: Self-pay | Admitting: Cardiology

## 2017-03-02 ENCOUNTER — Other Ambulatory Visit: Payer: Self-pay | Admitting: Cardiovascular Disease

## 2017-08-16 ENCOUNTER — Ambulatory Visit (INDEPENDENT_AMBULATORY_CARE_PROVIDER_SITE_OTHER): Payer: Medicare Other | Admitting: *Deleted

## 2017-08-16 DIAGNOSIS — I441 Atrioventricular block, second degree: Secondary | ICD-10-CM | POA: Diagnosis not present

## 2017-08-17 LAB — CUP PACEART REMOTE DEVICE CHECK
Battery Remaining Longevity: 83 mo
Battery Voltage: 3.01 V
Brady Statistic AP VS Percent: 0.2 %
Brady Statistic AS VP Percent: 54.54 %
Brady Statistic AS VS Percent: 0.51 %
Implantable Lead Implant Date: 20160531
Implantable Lead Implant Date: 20160531
Implantable Lead Location: 753859
Implantable Lead Location: 753860
Lead Channel Impedance Value: 399 Ohm
Lead Channel Pacing Threshold Amplitude: 1.125 V
Lead Channel Pacing Threshold Pulse Width: 0.4 ms
Lead Channel Sensing Intrinsic Amplitude: 1.375 mV
Lead Channel Sensing Intrinsic Amplitude: 22.625 mV
Lead Channel Setting Pacing Pulse Width: 0.4 ms
Lead Channel Setting Sensing Sensitivity: 1.2 mV
MDC IDC MSMT LEADCHNL RA IMPEDANCE VALUE: 285 Ohm
MDC IDC MSMT LEADCHNL RA IMPEDANCE VALUE: 399 Ohm
MDC IDC MSMT LEADCHNL RA SENSING INTR AMPL: 1.375 mV
MDC IDC MSMT LEADCHNL RV IMPEDANCE VALUE: 342 Ohm
MDC IDC MSMT LEADCHNL RV PACING THRESHOLD AMPLITUDE: 0.75 V
MDC IDC MSMT LEADCHNL RV PACING THRESHOLD PULSEWIDTH: 0.4 ms
MDC IDC MSMT LEADCHNL RV SENSING INTR AMPL: 22.625 mV
MDC IDC PG IMPLANT DT: 20160531
MDC IDC SESS DTM: 20181120213556
MDC IDC SET LEADCHNL RA PACING AMPLITUDE: 2.25 V
MDC IDC SET LEADCHNL RV PACING AMPLITUDE: 2 V
MDC IDC STAT BRADY AP VP PERCENT: 44.75 %
MDC IDC STAT BRADY RA PERCENT PACED: 44.71 %
MDC IDC STAT BRADY RV PERCENT PACED: 99.03 %

## 2017-08-17 NOTE — Progress Notes (Signed)
Remote pacemaker transmission.   

## 2017-08-25 ENCOUNTER — Encounter: Payer: Self-pay | Admitting: Cardiology

## 2017-08-30 ENCOUNTER — Other Ambulatory Visit: Payer: Self-pay | Admitting: Cardiovascular Disease

## 2017-08-30 NOTE — Telephone Encounter (Signed)
Please review for refill.  I do not see any recent liver/lipid on this patient.  The patient doesn't follow up until 2019.

## 2017-09-30 ENCOUNTER — Encounter: Payer: Self-pay | Admitting: Cardiovascular Disease

## 2017-09-30 ENCOUNTER — Ambulatory Visit: Payer: Medicare Other | Admitting: Cardiovascular Disease

## 2017-09-30 VITALS — BP 120/70 | HR 79 | Ht 74.0 in | Wt 247.5 lb

## 2017-09-30 DIAGNOSIS — Z951 Presence of aortocoronary bypass graft: Secondary | ICD-10-CM | POA: Diagnosis not present

## 2017-09-30 DIAGNOSIS — I495 Sick sinus syndrome: Secondary | ICD-10-CM

## 2017-09-30 DIAGNOSIS — E782 Mixed hyperlipidemia: Secondary | ICD-10-CM

## 2017-09-30 DIAGNOSIS — Z950 Presence of cardiac pacemaker: Secondary | ICD-10-CM

## 2017-09-30 DIAGNOSIS — I25118 Atherosclerotic heart disease of native coronary artery with other forms of angina pectoris: Secondary | ICD-10-CM

## 2017-09-30 DIAGNOSIS — Z955 Presence of coronary angioplasty implant and graft: Secondary | ICD-10-CM

## 2017-09-30 NOTE — Progress Notes (Signed)
Cardiology Office Note  Date:  09/30/2017   ID:  Jose [name], DOB 1940/10/03, MRN [id number]  PCP:  Rusty Aus, MD   Chief Complaint  Patient presents with  . other    12 month follow up. Meds reviewed by the pt. verbally. "doing well."     HPI:  Jose [name] is a pleasant 77 year old gentleman with a history of  coronary artery disease, CABG,  sinus syndrome, bradycardia with heart rate in the 30s,  general malaise and shortness of breath,  admission to [facility] for Medtronic pacemaker, dual-chamber,  prior smoking history from age 30 up to 81, History of lobectomy for lesion in his lung, benign growth Who presents for routine follow-up of his bypass surgery  doing well  Denies any significant chest pain or shortness of breath on exertion No regular exercise program Had right knee surgery, right shoulder surgery,  right hip surgery  Reports having some numbness, tingling in his feet Takes Neurontin 200 mg twice daily Weight trending upwards, eating the wrong foods  Lab work reviewed with him Total chol 123, LDL 47 HBA1c 6.3  EKG personally reviewed by myself on todays visit  shows paced rhythm, 67 bpm wide QRS  Other past medical history reviewed  stent to his RCA in 2002 at the time of an MI, notes detailing 50-60% proximal LAD disease, 75% OM 2 disease,  catheterization July 2008 at which time the RCA was reportedly patent  Catheterization 09/12/2015 for depressed ejection fraction, wall motion abnormality, symptoms concerning for angina                      Mid RCA lesion, 85% stenosed. The lesion was previously treated with a stent (unknown type).                      Mid Cx lesion, 80% stenosed.                      LM lesion, 80% stenosed.                      1st Diag lesion, 90% stenosed.                      Prox LAD lesion, 60% stenosed.  Sent for bypass surgery 09/23/2015, Dr. Earlene Plater  Coronary artery bypass grafting x 4                       Left internal mammary graft to the LAD                      SVG to diagonal                      SVG to OM 2                      SVG to PDA Endoscopic vein harvest from the right leg Hospital records reviewed with him in detail  Previous Echocardiogram showed decreased ejection fraction from prior down to 40-45% with regions of wall motion abnormality   he was at [facility] over the summer 2016 when he developed malaise, shortness of breath. He went to the emergency room and was found to have heart rate in the 30s. Pacemaker was placed at [facility] without complication. No ischemia workup at  that time    PMH:   has a past medical history of Coronary artery disease (2002), Headache, History of lobectomy of lung (2014), Hypercholesteremia, Hypertension, Hypothyroidism, MI (myocardial infarction) ([place]), and Pacemaker.  PSH:    Past Surgical History:  Procedure Laterality Date  . CARDIAC CATHETERIZATION    . CARDIAC CATHETERIZATION Left 09/12/2015   Procedure: Left Heart Cath and Coronary Angiography;  Surgeon: Minna Merritts, MD;  Location: [place] CV LAB;  Service: Cardiovascular;  Laterality: Left;  . CARPAL TUNNEL RELEASE Bilateral   . CORONARY ANGIOPLASTY WITH STENT PLACEMENT    . CORONARY ARTERY BYPASS GRAFT N/A 09/23/2015   Procedure: CORONARY ARTERY BYPASS GRAFTING (CABG), ON PUMP, TIMES FOUR, USING LEFT INTERNAL MAMMARY ARTERY, RIGHT GREATER SAPHENOUS VEIN HARVESTED ENDOSCOPICALLY;  Surgeon: Gaye Pollack, MD;  Location: [place];  Service: Open Heart Surgery;  Laterality: N/A;  . HAMMER TOE SURGERY    . INSERT / REPLACE / REMOVE PACEMAKER  02/25/2015   Serial # [id number] H Model # A2DR01/ DDD  . LOBECTOMY     upper right   . SHOULDER SURGERY Right    replacement  . TEE WITHOUT CARDIOVERSION N/A 09/23/2015   Procedure: TRANSESOPHAGEAL ECHOCARDIOGRAM (TEE);  Surgeon: Gaye Pollack, MD;  Location: [place];  Service: Open Heart Surgery;  Laterality: N/A;  .  TOTAL HIP ARTHROPLASTY Right 06/14/2016   Procedure: TOTAL HIP ARTHROPLASTY;  Surgeon: Dereck Leep, MD;  Location: [facility] ORS;  Service: Orthopedics;  Laterality: Right;  . TOTAL KNEE ARTHROPLASTY     right    Current Outpatient Medications  Medication Sig Dispense Refill  . aspirin 81 MG EC tablet Take 1 tablet (81 mg total) by mouth daily.    Marland Kitchen levothyroxine (SYNTHROID, LEVOTHROID) 25 MCG tablet Take 25 mcg by mouth daily before breakfast.     . meloxicam (MOBIC) 15 MG tablet Take 15 mg by mouth daily.    . metoprolol tartrate (LOPRESSOR) 50 MG tablet TAKE 1 TABLET BY MOUTH TWICE DAILY 180 tablet 3  . Multiple Vitamins-Minerals (MULTIVITAMIN WITH MINERALS) tablet Take 1 tablet by mouth daily.    . simvastatin (ZOCOR) 40 MG tablet TAKE 1 TABLET BY MOUTH DAILY AT 6PM 90 tablet 2  . gabapentin (NEURONTIN) 100 MG capsule Take 2 capsules (200 mg total) by mouth 2 (two) times daily.     No current facility-administered medications for this visit.      Allergies:   Amoxicillin and Amoxicillin-pot clavulanate   Social History:  The patient  reports that he quit smoking about 26 years ago. His smoking use included cigarettes. He has a 25.00 pack-year smoking history. he has never used smokeless tobacco. He reports that he drinks about 8.4 oz of alcohol per week. He reports that he does not use drugs.   Family History:   family history includes Heart attack (age of onset: 63) in his mother.    Review of Systems: Review of Systems  Constitutional: Negative.   Respiratory: Negative.   Cardiovascular: Negative.   Gastrointestinal: Negative.   Musculoskeletal: Positive for joint pain.  Neurological: Negative.   Psychiatric/Behavioral: Negative.   All other systems reviewed and are negative.    PHYSICAL EXAM: VS:  BP 120/70 (BP Location: Left Arm, Patient Position: Sitting, Cuff Size: Normal)   Pulse 79   Ht 6' 2"  (1.88 m)   Wt 247 lb 8 oz (112.3 kg)   BMI 31.78 kg/m  , BMI Body mass  index is 31.78 kg/m. GEN: Well nourished,  well developed, in no acute distress  HEENT: normal  Neck: no JVD, carotid bruits, or masses Cardiac: RRR; no murmurs, rubs, or gallops,no edema  Respiratory:  clear to auscultation bilaterally, normal work of breathing GI: soft, nontender, nondistended, + BS MS: no deformity or atrophy  Skin: warm and dry, no rash Neuro:  Strength and sensation are intact Psych: euthymic mood, full affect    Recent Labs: No results found for requested labs within last 8760 hours.    Lipid Panel No results found for: CHOL, HDL, LDLCALC, TRIG    Wt Readings from Last 3 Encounters:  09/30/17 247 lb 8 oz (112.3 kg)  09/14/16 249 lb 8 oz (113.2 kg)  06/14/16 234 lb 5.6 oz (106.3 kg)       ASSESSMENT AND PLAN:  Paroxysmal atrial fibrillation (HCC) - Plan: EKG 12-Lead Maintaining normal sinus rhythm, no changes to his medications Stable  Hyperlipidemia Cholesterol is at goal on the current lipid regimen. No changes to the medications were made. Stable  Coronary artery disease involving native coronary artery of native heart without angina pectoris Denies having any symptoms concerning for angina. No further testing at this time  Sick sinus syndrome [facility]) Status post pacemaker followed by Dr. Caryl Comes  S/P CABG x 4  bypass surgery December 2016. Previously stopped Brilinta Now on low-dose aspirin    Total encounter time more than 25 minutes  Greater than 50% was spent in counseling and coordination of care with the patient   Disposition:   F/U  12 months   Orders Placed This Encounter  Procedures  . EKG 12-Lead     Signed, Esmond Plants, M.D., Ph.D. 09/30/2017  [place], [place]

## 2017-09-30 NOTE — Patient Instructions (Signed)

## 2017-11-15 ENCOUNTER — Encounter: Payer: Medicare Other | Admitting: *Deleted

## 2017-11-15 ENCOUNTER — Telehealth: Payer: Self-pay | Admitting: Cardiology

## 2017-11-15 NOTE — Telephone Encounter (Signed)
Spoke with pt and reminded pt of remote transmission that is due today. Pt verbalized understanding.   

## 2017-11-17 ENCOUNTER — Encounter: Payer: Self-pay | Admitting: Cardiology

## 2017-12-20 ENCOUNTER — Ambulatory Visit (INDEPENDENT_AMBULATORY_CARE_PROVIDER_SITE_OTHER): Payer: Medicare Other | Admitting: Internal Medicine

## 2017-12-20 ENCOUNTER — Encounter: Payer: Self-pay | Admitting: Internal Medicine

## 2017-12-20 VITALS — BP 114/60 | HR 69 | Ht 73.0 in | Wt 242.0 lb

## 2017-12-20 DIAGNOSIS — Z950 Presence of cardiac pacemaker: Secondary | ICD-10-CM | POA: Diagnosis not present

## 2017-12-20 DIAGNOSIS — R001 Bradycardia, unspecified: Secondary | ICD-10-CM | POA: Diagnosis not present

## 2017-12-20 DIAGNOSIS — I440 Atrioventricular block, first degree: Secondary | ICD-10-CM

## 2017-12-20 NOTE — Progress Notes (Signed)
Patient Care Team: Rusty Aus, MD as PCP - General (Internal Medicine)   HPI  Jose [name] is a 77 y.o. 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 He has a history of coronary artery disease with prior bypass surgery 12/16 EF at that time was 40-45%. These records were reviewed. This occurred outside of the context of ACS.  DATE TEST EF   12/16 Echo   40-45 %   3/18 Echo   55-60 %         He has dyspnea on exertion associated with chest pain.  He has no peripheral edema or abdominal distention  He notes sleep disturbance.  A home sleep study was abnormal.  He made mild efforts to tolerate CPAP and got discouraged.  He is also having vivid dreams.    Records and Results Reviewed   Past Medical History:  Diagnosis Date  . Coronary artery disease 2002   RCA stent, 2002. 50-60% stenosis of proximal LAD, 75% stenosis OM2, patent stent mid RCA by cardiac catheterization, 04/17/07  . Headache    hx of migraines - went away in his 69's  . 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 Hypercholesteremia   . Hypertension   . Hypothyroidism   . MI (myocardial infarction) ([place])   . Pacemaker     Past Surgical History:  Procedure Laterality Date  . CARDIAC CATHETERIZATION    . CARDIAC CATHETERIZATION Left 09/12/2015   Procedure: Left Heart Cath and Coronary Angiography;  Surgeon: Minna Merritts, MD;  Location: [place] CV LAB;  Service: Cardiovascular;  Laterality: Left;  . CARPAL TUNNEL RELEASE Bilateral   . CORONARY ANGIOPLASTY WITH STENT PLACEMENT    . CORONARY ARTERY BYPASS GRAFT N/A 09/23/2015   Procedure: CORONARY ARTERY BYPASS GRAFTING (CABG), ON PUMP, TIMES FOUR, USING LEFT INTERNAL MAMMARY ARTERY, RIGHT GREATER SAPHENOUS VEIN HARVESTED ENDOSCOPICALLY;  Surgeon: Gaye Pollack, MD;  Location: [place];  Service: Open Heart Surgery;  Laterality: N/A;  . HAMMER TOE SURGERY    . INSERT  / REPLACE / REMOVE PACEMAKER  02/25/2015   Serial # [id number] H Model # A2DR01/ DDD  . LOBECTOMY     upper right   . SHOULDER SURGERY Right    replacement  . TEE WITHOUT CARDIOVERSION N/A 09/23/2015   Procedure: TRANSESOPHAGEAL ECHOCARDIOGRAM (TEE);  Surgeon: Gaye Pollack, MD;  Location: [place];  Service: Open Heart Surgery;  Laterality: N/A;  . TOTAL HIP ARTHROPLASTY Right 06/14/2016   Procedure: TOTAL HIP ARTHROPLASTY;  Surgeon: Dereck Leep, MD;  Location: [facility] ORS;  Service: Orthopedics;  Laterality: Right;  . TOTAL KNEE ARTHROPLASTY     right    Current Outpatient Medications  Medication Sig Dispense Refill  . aspirin 81 MG EC tablet Take 1 tablet (81 mg total) by mouth daily.    Marland Kitchen gabapentin (NEURONTIN) 100 MG capsule Take 2 capsules (200 mg total) by mouth 2 (two) times daily.    Marland Kitchen levothyroxine (SYNTHROID, LEVOTHROID) 25 MCG tablet Take 25 mcg by mouth daily before breakfast.     . meloxicam (MOBIC) 15 MG tablet Take 15 mg by mouth daily.    . metoprolol tartrate (LOPRESSOR) 50 MG tablet TAKE 1 TABLET BY MOUTH TWICE DAILY 180 tablet 3  . Multiple Vitamins-Minerals (MULTIVITAMIN WITH MINERALS) tablet Take 1 tablet by mouth daily.    . simvastatin (ZOCOR) 40 MG tablet TAKE 1  TABLET BY MOUTH DAILY AT 6PM 90 tablet 2   No current facility-administered medications for this visit.     Allergies  Allergen Reactions  . Amoxicillin Nausea Only  . Amoxicillin-Pot Clavulanate Other (See Comments) and Nausea Only    Patient reports he took oral Augmentin after a foot surgery, and it caused severe nausea; however reports that he can take 'plain' amoxicillin; he takes amoxicillin prior to any dentist procedures, and has on hand Patient reports he took oral Augmentin after a foot surgery, and it caused severe nausea; however reports that he can take 'plain' amoxicillin; he takes amoxicillin prior to any dentist procedures, and has on hand Patient reports he took oral Augmentin after a foot  surgery, and it caused severe nausea; however reports that he can take 'plain' amoxicillin; he takes amoxicillin prior to any dentist procedures, and has on hand       Review of Systems negative except from HPI and PMH  Physical Exam BP 114/60 (BP Location: Left Arm, Patient Position: Sitting, Cuff Size: Normal)   Pulse 69   Ht _0  (1.854 m)   Wt 242 lb (109.8 kg)   BMI 31.93 kg/m  Well developed and nourished in no acute distress HENT normal Neck supple with JVP-flat Clear Regular rate and rhythm, no murmurs or gallops Abd-soft with active BS No Clubbing cyanosis edema Skin-warm and dry A & Oriented  Grossly normal sensory and motor function   ECG demonstrates P-synchronous/ AV  pacing  Assessment and  Plan  Complete heart block   Pacemaker-Medtronic  Sleep disturbance   Coronary artery disease with prior stenting and interval bypass surgery  Atrial fibrillation-nonsustained \ No intercurrent atrial fibrillation or flutter  Without symptoms of ischemia  2 issues related to his sleep one is a beta-blocker be contributing to his dreaming.  Now that his coronary disease is stable,We have the luxury of weaning him off.  We will see what impact this might have.  I have encouraged him also to try melatonin as well as to discuss with his DME company his sleep machine to see if it can be made more tolerable  We spent more than 50% of our >25 min visit in face to face counseling regarding the above       Current medicines are reviewed at length with the patient today .  The patient does have concerns regarding medicines.

## 2017-12-20 NOTE — Patient Instructions (Signed)
Medication Instructions: - Your physician has recommended you make the following change in your medication:  1) STOP lopressor (metoprolol)- we will wean you off of this as below: - take 1/2 tablet (25 mg) twice daily x 1 week, then - take 1/2 tablet (25 mg) once daily x 1 week, then - stop  Labwork: - none ordered  Procedures/Testing: - none ordered  Follow-Up: - Remote monitoring is used to monitor your Pacemaker of ICD from home. This monitoring reduces the number of office visits required to check your device to one time per year. It allows us to keep an eye on the functioning of your device to ensure it is working properly. You are scheduled for a device check from home on 03/21/18. You may send your transmission at any time that day. If you have a wireless device, the transmission will be sent automatically. After your physician reviews your transmission, you will receive a postcard with your next transmission date.  - Your physician wants you to follow-up in: 1 year with Dr. Graciela HusbandsKlein. You will receive a reminder letter in the mail two months in advance. If you don't receive a letter, please call our office to schedule the follow-up appointment.   Any Additional Special Instructions Will Be Listed Below (If Applicable).     If you need a refill on your cardiac medications before your next appointment, please call your pharmacy.

## 2018-03-21 ENCOUNTER — Encounter: Payer: Medicare Other | Admitting: *Deleted

## 2018-03-21 ENCOUNTER — Telehealth: Payer: Self-pay

## 2018-03-21 NOTE — Telephone Encounter (Signed)
Spoke with pt and reminded pt of remote transmission that is due today. Pt verbalized understanding.   

## 2018-03-22 ENCOUNTER — Encounter: Payer: Self-pay | Admitting: Cardiology

## 2018-04-21 ENCOUNTER — Encounter: Payer: Self-pay | Admitting: Cardiology

## 2018-05-03 ENCOUNTER — Ambulatory Visit (INDEPENDENT_AMBULATORY_CARE_PROVIDER_SITE_OTHER): Payer: Medicare Other | Admitting: *Deleted

## 2018-05-03 DIAGNOSIS — I441 Atrioventricular block, second degree: Secondary | ICD-10-CM

## 2018-05-03 DIAGNOSIS — I495 Sick sinus syndrome: Secondary | ICD-10-CM

## 2018-05-04 NOTE — Progress Notes (Signed)
Remote pacemaker transmission.   

## 2018-05-23 LAB — CUP PACEART REMOTE DEVICE CHECK
Battery Remaining Longevity: 70 mo
Battery Voltage: 3 V
Brady Statistic RA Percent Paced: 13.54 %
Brady Statistic RV Percent Paced: 97.56 %
Implantable Lead Implant Date: 20160531
Implantable Lead Location: 753860
Implantable Lead Model: 5076
Implantable Pulse Generator Implant Date: 20160531
Lead Channel Impedance Value: 342 Ohm
Lead Channel Impedance Value: 399 Ohm
Lead Channel Pacing Threshold Pulse Width: 0.4 ms
Lead Channel Sensing Intrinsic Amplitude: 1.25 mV
Lead Channel Setting Pacing Amplitude: 2 V
Lead Channel Setting Pacing Amplitude: 2.75 V
Lead Channel Setting Sensing Sensitivity: 1.2 mV
MDC IDC LEAD IMPLANT DT: 20160531
MDC IDC LEAD LOCATION: 753859
MDC IDC MSMT LEADCHNL RA IMPEDANCE VALUE: 285 Ohm
MDC IDC MSMT LEADCHNL RA IMPEDANCE VALUE: 380 Ohm
MDC IDC MSMT LEADCHNL RA PACING THRESHOLD AMPLITUDE: 1.375 V
MDC IDC MSMT LEADCHNL RA SENSING INTR AMPL: 1.25 mV
MDC IDC MSMT LEADCHNL RV PACING THRESHOLD AMPLITUDE: 0.875 V
MDC IDC MSMT LEADCHNL RV PACING THRESHOLD PULSEWIDTH: 0.4 ms
MDC IDC MSMT LEADCHNL RV SENSING INTR AMPL: 23.875 mV
MDC IDC MSMT LEADCHNL RV SENSING INTR AMPL: 23.875 mV
MDC IDC SESS DTM: 20190806184013
MDC IDC SET LEADCHNL RV PACING PULSEWIDTH: 0.4 ms
MDC IDC STAT BRADY AP VP PERCENT: 13.56 %
MDC IDC STAT BRADY AP VS PERCENT: 0.11 %
MDC IDC STAT BRADY AS VP PERCENT: 84.42 %
MDC IDC STAT BRADY AS VS PERCENT: 1.91 %

## 2018-08-02 ENCOUNTER — Telehealth: Payer: Self-pay

## 2018-08-02 ENCOUNTER — Encounter: Payer: Medicare Other | Admitting: *Deleted

## 2018-08-02 NOTE — Telephone Encounter (Signed)
Spoke with pt and reminded pt of remote transmission that is due today. Pt verbalized understanding.   

## 2018-08-04 ENCOUNTER — Ambulatory Visit (INDEPENDENT_AMBULATORY_CARE_PROVIDER_SITE_OTHER): Payer: Medicare Other | Admitting: *Deleted

## 2018-08-04 DIAGNOSIS — I495 Sick sinus syndrome: Secondary | ICD-10-CM

## 2018-08-06 ENCOUNTER — Encounter: Payer: Self-pay | Admitting: Cardiology

## 2018-08-06 NOTE — Progress Notes (Signed)
Remote pacemaker transmission.   

## 2018-09-28 LAB — CUP PACEART REMOTE DEVICE CHECK
Battery Remaining Longevity: 63 mo
Battery Voltage: 3 V
Brady Statistic AP VP Percent: 17.35 %
Brady Statistic RA Percent Paced: 17.29 %
Brady Statistic RV Percent Paced: 99.44 %
Implantable Lead Implant Date: 20160531
Implantable Lead Location: 753859
Implantable Lead Location: 753860
Implantable Lead Model: 5076
Implantable Pulse Generator Implant Date: 20160531
Lead Channel Impedance Value: 323 Ohm
Lead Channel Impedance Value: 380 Ohm
Lead Channel Impedance Value: 418 Ohm
Lead Channel Pacing Threshold Pulse Width: 0.4 ms
Lead Channel Sensing Intrinsic Amplitude: 1.375 mV
Lead Channel Sensing Intrinsic Amplitude: 24.625 mV
Lead Channel Setting Pacing Amplitude: 2 V
Lead Channel Setting Pacing Amplitude: 4 V
Lead Channel Setting Sensing Sensitivity: 1.2 mV
MDC IDC LEAD IMPLANT DT: 20160531
MDC IDC MSMT LEADCHNL RA IMPEDANCE VALUE: 304 Ohm
MDC IDC MSMT LEADCHNL RA PACING THRESHOLD AMPLITUDE: 1.875 V
MDC IDC MSMT LEADCHNL RA SENSING INTR AMPL: 1.375 mV
MDC IDC MSMT LEADCHNL RV PACING THRESHOLD AMPLITUDE: 0.75 V
MDC IDC MSMT LEADCHNL RV PACING THRESHOLD PULSEWIDTH: 0.4 ms
MDC IDC MSMT LEADCHNL RV SENSING INTR AMPL: 24.625 mV
MDC IDC SESS DTM: 20191108214229
MDC IDC SET LEADCHNL RV PACING PULSEWIDTH: 0.4 ms
MDC IDC STAT BRADY AP VS PERCENT: 0.02 %
MDC IDC STAT BRADY AS VP PERCENT: 82.29 %
MDC IDC STAT BRADY AS VS PERCENT: 0.35 %

## 2018-11-24 ENCOUNTER — Encounter: Payer: Self-pay | Admitting: Cardiology

## 2018-12-06 ENCOUNTER — Other Ambulatory Visit: Payer: Self-pay

## 2018-12-06 ENCOUNTER — Ambulatory Visit (INDEPENDENT_AMBULATORY_CARE_PROVIDER_SITE_OTHER): Payer: Medicare Other | Admitting: *Deleted

## 2018-12-06 DIAGNOSIS — I441 Atrioventricular block, second degree: Secondary | ICD-10-CM

## 2018-12-07 LAB — CUP PACEART REMOTE DEVICE CHECK
Battery Remaining Longevity: 61 mo
Battery Voltage: 3 V
Brady Statistic AP VP Percent: 15.99 %
Brady Statistic AS VS Percent: 0.25 %
Brady Statistic RA Percent Paced: 15.94 %
Brady Statistic RV Percent Paced: 99.5 %
Date Time Interrogation Session: 20200310185954
Implantable Lead Implant Date: 20160531
Implantable Lead Location: 753860
Implantable Lead Model: 5076
Implantable Lead Model: 5076
Implantable Pulse Generator Implant Date: 20160531
Lead Channel Impedance Value: 342 Ohm
Lead Channel Impedance Value: 399 Ohm
Lead Channel Pacing Threshold Amplitude: 0.625 V
Lead Channel Pacing Threshold Amplitude: 2 V
Lead Channel Pacing Threshold Pulse Width: 0.4 ms
Lead Channel Sensing Intrinsic Amplitude: 1.125 mV
Lead Channel Setting Pacing Amplitude: 4 V
Lead Channel Setting Sensing Sensitivity: 1.2 mV
MDC IDC LEAD IMPLANT DT: 20160531
MDC IDC LEAD LOCATION: 753859
MDC IDC MSMT LEADCHNL RA IMPEDANCE VALUE: 323 Ohm
MDC IDC MSMT LEADCHNL RA IMPEDANCE VALUE: 437 Ohm
MDC IDC MSMT LEADCHNL RA SENSING INTR AMPL: 1.125 mV
MDC IDC MSMT LEADCHNL RV PACING THRESHOLD PULSEWIDTH: 0.4 ms
MDC IDC MSMT LEADCHNL RV SENSING INTR AMPL: 13.125 mV
MDC IDC MSMT LEADCHNL RV SENSING INTR AMPL: 13.125 mV
MDC IDC SET LEADCHNL RV PACING AMPLITUDE: 2 V
MDC IDC SET LEADCHNL RV PACING PULSEWIDTH: 0.4 ms
MDC IDC STAT BRADY AP VS PERCENT: 0.01 %
MDC IDC STAT BRADY AS VP PERCENT: 83.75 %

## 2018-12-13 NOTE — Progress Notes (Signed)
Remote pacemaker transmission.   

## 2019-03-05 ENCOUNTER — Ambulatory Visit: Payer: Medicare Other | Admitting: Cardiovascular Disease

## 2019-03-07 ENCOUNTER — Encounter: Payer: Medicare Other | Admitting: *Deleted

## 2019-03-08 ENCOUNTER — Telehealth: Payer: Self-pay

## 2019-03-08 NOTE — Telephone Encounter (Signed)
Left message for patient to remind of missed remote transmission.  

## 2019-03-16 ENCOUNTER — Encounter: Payer: Self-pay | Admitting: Cardiology

## 2019-09-16 NOTE — Progress Notes (Signed)
Cardiology Office Note  Date:  09/17/2019   ID:  ZIDAN HELGET, DOB Feb 03, 1941, MRN [id number]  PCP:  Rusty Aus, MD   Chief Complaint  Patient presents with  . Follow-up    12 month follow up. Meds reviewed by the pt. verbally. "doing well."     HPI:  Mr. 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  On last pacer download March 2020 Denies any palpitations It appears metoprolol was either stopped or fell off his medication list last year He feels that primary care may have stopped the medication for unclear reasons Primary care notes reviewed, no documentation of this  Neuropathy in feet, tingling Bothers his walking and at nighttime  Weight down 12 pounds, less carbs  No exercise Has chronic discomfort, right knee surgery, right shoulder surgery,  right hip surgery  Lab work reviewed with him Total chol 129, LDL 52 HBA1c 5.8  EKG personally reviewed by myself on todays visit  shows paced rhythm, 67 bpm wide QRS  Other past medical history reviewed  stent to his RCA in 2002 at the time of an MI, notes detailing 50-60% proximal LAD disease, 75% OM 2 disease,  catheterization July 2008 at which time the RCA was reportedly patent  Catheterization 09/12/2015 for depressed ejection fraction, wall motion abnormality, symptoms concerning for angina                      Mid RCA lesion, 85% stenosed. The lesion was previously treated with a stent (unknown type).                       Mid Cx lesion, 80% stenosed.                      LM lesion, 80% stenosed.                      1st Diag lesion, 90% stenosed.                      Prox LAD lesion, 60% stenosed.  Sent for bypass surgery 09/23/2015, Dr. Earlene Plater  Coronary artery bypass grafting x 4                      Left internal mammary graft to the LAD                      SVG to diagonal                      SVG to OM 2                      SVG to PDA Endoscopic vein harvest from the right leg Hospital records reviewed with him in detail  Previous Echocardiogram showed decreased ejection fraction from prior down to 40-45% with regions of wall  motion abnormality   he was at [facility] over the summer 2016 when he developed malaise, shortness of breath. He went to the emergency room and was found to have heart rate in the 30s. Pacemaker was placed at [facility] without complication. No ischemia workup at that time    PMH:   has a past medical history of Coronary artery disease (2002), Headache, History of lobectomy of lung (2014), Hypercholesteremia, Hypertension, Hypothyroidism, MI (myocardial infarction) ([place]), and Pacemaker.  PSH:    Past Surgical History:  Procedure Laterality Date  . CARDIAC CATHETERIZATION    . CARDIAC CATHETERIZATION Left 09/12/2015   Procedure: Left Heart Cath and Coronary Angiography;  Surgeon: Minna Merritts, MD;  Location: [place] [facility];  Service: Cardiovascular;  Laterality: Left;  . CARPAL TUNNEL RELEASE Bilateral   . CORONARY ANGIOPLASTY WITH STENT PLACEMENT    . CORONARY ARTERY BYPASS GRAFT N/A 09/23/2015   Procedure: CORONARY ARTERY BYPASS GRAFTING (CABG), ON PUMP, TIMES FOUR, USING LEFT INTERNAL MAMMARY ARTERY, RIGHT GREATER SAPHENOUS VEIN HARVESTED ENDOSCOPICALLY;  Surgeon: Gaye Pollack, MD;  Location: [place];  Service: Open Heart Surgery;  Laterality: N/A;  . HAMMER TOE SURGERY    . INSERT / REPLACE / REMOVE PACEMAKER  02/25/2015   Serial # [id number]  Model # A2DR01/ DDD  . LOBECTOMY     upper right   . SHOULDER SURGERY Right    replacement  . TEE WITHOUT CARDIOVERSION N/A 09/23/2015   Procedure: TRANSESOPHAGEAL ECHOCARDIOGRAM (TEE);  Surgeon: Gaye Pollack, MD;  Location: [place];  Service: Open Heart Surgery;  Laterality: N/A;  . TOTAL HIP ARTHROPLASTY Right 06/14/2016   Procedure: TOTAL HIP ARTHROPLASTY;  Surgeon: Dereck Leep, MD;  Location: [facility];  Service: Orthopedics;  Laterality: Right;  . TOTAL KNEE ARTHROPLASTY     right    Current Outpatient Medications  Medication Sig Dispense Refill  . aspirin 81 MG EC tablet Take 1 tablet (81 mg total) by mouth daily.    Marland Kitchen etodolac (LODINE) 400 MG tablet Take 400 mg by mouth daily.     Marland Kitchen gabapentin (NEURONTIN) 300 MG capsule Take 300 mg by mouth 2 (two) times daily.     Marland Kitchen latanoprost (XALATAN) 0.005 % ophthalmic solution Place 1 drop into the right eye at bedtime.     Marland Kitchen levothyroxine (SYNTHROID, LEVOTHROID) 25 MCG tablet Take 25 mcg by mouth daily before breakfast.     . meloxicam (MOBIC) 15 MG tablet Take 15 mg by mouth daily.    . Multiple Vitamins-Minerals (MULTIVITAMIN WITH MINERALS) tablet Take 1 tablet by mouth daily.    . simvastatin (ZOCOR) 40 MG tablet TAKE 1 TABLET BY MOUTH DAILY AT 6PM 90 tablet 2   No current facility-administered medications for this visit.     Allergies:   Amoxicillin and Amoxicillin-pot clavulanate   Social History:  The patient  reports that he quit smoking about 28 years ago. His smoking use included cigarettes. He has a 25.00 pack-year smoking history. He has never used smokeless tobacco. He reports current alcohol use of about 14.0 standard drinks of alcohol per week. He reports that he does not use drugs.   Family History:   family history includes Heart attack (age of onset: 54) in his mother.   Review of Systems: Review of Systems  Constitutional: Negative.   HENT: Negative.   Respiratory: Negative.   Cardiovascular: Negative.    Gastrointestinal: Negative.   Musculoskeletal: Positive for joint pain.  Neurological: Negative.  Psychiatric/Behavioral: Negative.   All other systems reviewed and are negative.   PHYSICAL EXAM: VS:  BP 134/79 (BP Location: Left Arm, Patient Position: Sitting, Cuff Size: Normal)   Pulse 67   Ht _0  (1.854 m)   Wt 235 lb 8 oz (106.8 kg)   BMI 31.07 kg/m  , BMI Body mass index is 31.07 kg/m. Constitutional:  oriented to person, place, and time. No distress.  HENT:  Head: Grossly normal Eyes:  no discharge. No scleral icterus.  Neck: No JVD, no carotid bruits  Cardiovascular: Regular rate and rhythm, no murmurs appreciated Pulmonary/Chest: Clear to auscultation bilaterally, no wheezes or rails Abdominal: Soft.  no distension.  no tenderness.  Musculoskeletal: Normal range of motion Neurological:  normal muscle tone. Coordination normal. No atrophy Skin: Skin warm and dry Psychiatric: normal affect, pleasant   Recent Labs: No results found for requested labs within last 8760 hours.    Lipid Panel No results found for: CHOL, HDL, LDLCALC, TRIG    Wt Readings from Last 3 Encounters:  09/17/19 235 lb 8 oz (106.8 kg)  12/20/17 242 lb (109.8 kg)  09/30/17 247 lb 8 oz (112.3 kg)     ASSESSMENT AND PLAN:  Paroxysmal atrial fibrillation (HCC) - Plan: EKG 12-Lead Maintaining normal sinus rhythm, no changes to his medications Stable, paced Recommended a pacer download  Hyperlipidemia Cholesterol is at goal on the current lipid regimen. No changes to the medications were made. stable  Coronary artery disease involving native coronary artery of native heart without angina pectoris Currently with no symptoms of angina. No further workup at this time. Continue current medication regimen.  Sick sinus syndrome [facility]) Status post pacemaker followed by Dr. Caryl Comes Needs download.  Discussed with nursing today to help arrange  S/P CABG x 4  bypass surgery December 2016.  on  low-dose aspirin No Angina    Total encounter time more than 25 minutes  Greater than 50% was spent in counseling and coordination of care with the patient   Disposition:   F/U  12 months   No orders of the defined types were placed in this encounter.    Signed, Esmond Plants, M.D., Ph.D. 09/17/2019  [place], [place]

## 2019-09-17 ENCOUNTER — Encounter: Payer: Self-pay | Admitting: Cardiovascular Disease

## 2019-09-17 ENCOUNTER — Other Ambulatory Visit: Payer: Self-pay

## 2019-09-17 ENCOUNTER — Ambulatory Visit (INDEPENDENT_AMBULATORY_CARE_PROVIDER_SITE_OTHER): Payer: Medicare Other | Admitting: Cardiovascular Disease

## 2019-09-17 VITALS — BP 134/79 | HR 67 | Ht 73.0 in | Wt 235.5 lb

## 2019-09-17 DIAGNOSIS — Z951 Presence of aortocoronary bypass graft: Secondary | ICD-10-CM

## 2019-09-17 DIAGNOSIS — E782 Mixed hyperlipidemia: Secondary | ICD-10-CM | POA: Diagnosis not present

## 2019-09-17 DIAGNOSIS — I495 Sick sinus syndrome: Secondary | ICD-10-CM | POA: Diagnosis not present

## 2019-09-17 DIAGNOSIS — Z950 Presence of cardiac pacemaker: Secondary | ICD-10-CM

## 2019-09-17 DIAGNOSIS — I25118 Atherosclerotic heart disease of native coronary artery with other forms of angina pectoris: Secondary | ICD-10-CM | POA: Diagnosis not present

## 2019-09-17 MED ORDER — METOPROLOL SUCCINATE ER 25 MG PO TB24: 25.0000 mg | ORAL_TABLET | Freq: Every day | ORAL | 3 refills | Status: DC

## 2019-09-17 NOTE — Patient Instructions (Addendum)
Medication Instructions:  Your physician has recommended you make the following change in your medication:  1. RESTART Metoprolol Succinate 25 mg once daily    If you need a refill on your cardiac medications before your next appointment, please call your pharmacy.    Lab work: No new labs needed   If you have labs (blood work) drawn today and your tests are completely normal, you will receive your results only by: Marland Kitchen MyChart Message (if you have MyChart) OR . A paper copy in the mail If you have any lab test that is abnormal or we need to change your treatment, we will call you to review the results.   Testing/Procedures: No new testing needed   Follow-Up: At [facility], you and your health needs are our priority.  As part of our continuing mission to provide you with exceptional heart care, we have created designated Provider Care Teams.  These Care Teams include your primary Cardiologist (physician) and Advanced Practice Providers (APPs -  Physician Assistants and Nurse Practitioners) who all work together to provide you with the care you need, when you need it.  . You will need a follow up appointment in 12 months . Needs appt with Dr. Caryl Comes   . Providers on your designated Care Team:   . Murray Hodgkins, NP . Christell Faith, PA-C . Marrianne Mood, PA-C  Any Other Special Instructions Will Be Listed Below (If Applicable).  For educational health videos Log in to : [email or web address] Or : [email or web address], password : [email or web address]

## 2019-12-25 ENCOUNTER — Encounter: Payer: Medicare Other | Admitting: Internal Medicine

## 2020-02-05 ENCOUNTER — Other Ambulatory Visit: Payer: Self-pay

## 2020-02-05 ENCOUNTER — Encounter: Payer: Self-pay | Admitting: Internal Medicine

## 2020-02-05 ENCOUNTER — Ambulatory Visit (INDEPENDENT_AMBULATORY_CARE_PROVIDER_SITE_OTHER): Payer: Medicare PPO | Admitting: Internal Medicine

## 2020-02-05 VITALS — BP 120/58 | HR 68 | Ht 74.0 in | Wt 235.2 lb

## 2020-02-05 DIAGNOSIS — I429 Cardiomyopathy, unspecified: Secondary | ICD-10-CM | POA: Diagnosis not present

## 2020-02-05 DIAGNOSIS — I495 Sick sinus syndrome: Secondary | ICD-10-CM | POA: Diagnosis not present

## 2020-02-05 DIAGNOSIS — I442 Atrioventricular block, complete: Secondary | ICD-10-CM

## 2020-02-05 DIAGNOSIS — Z950 Presence of cardiac pacemaker: Secondary | ICD-10-CM | POA: Diagnosis not present

## 2020-02-05 NOTE — Patient Instructions (Signed)
Medication Instructions:  - Your physician recommends that you continue on your current medications as directed. Please refer to the Current Medication list given to you today.  *If you need a refill on your cardiac medications before your next appointment, please call your pharmacy*   Lab Work: - none ordered  If you have labs (blood work) drawn today and your tests are completely normal, you will receive your results only by: Marland Kitchen MyChart Message (if you have MyChart) OR . A paper copy in the mail If you have any lab test that is abnormal or we need to change your treatment, we will call you to review the results.   Testing/Procedures: - Your physician has requested that you have an echocardiogram. Echocardiography is a painless test that uses sound waves to create images of your heart. It provides your doctor with information about the size and shape of your heart and how well your heart's chambers and valves are working. This procedure takes approximately one hour. There are no restrictions for this procedure.   Follow-Up: At [facility], you and your health needs are our priority.  As part of our continuing mission to provide you with exceptional heart care, we have created designated Provider Care Teams.  These Care Teams include your primary Cardiologist (physician) and Advanced Practice Providers (APPs -  Physician Assistants and Nurse Practitioners) who all work together to provide you with the care you need, when you need it.  We recommend signing up for the patient portal called "MyChart".  Sign up information is provided on this After Visit Summary.  MyChart is used to connect with patients for Virtual Visits (Telemedicine).  Patients are able to view lab/test results, encounter notes, upcoming appointments, etc.  Non-urgent messages can be sent to your provider as well.   To learn more about what you can do with MyChart, go to [email or web address].    Your next appointment:    1 year(s)  The format for your next appointment:   In Person  Provider:   Sherryl Manges, MD   Other Instructions n/a

## 2020-02-05 NOTE — Progress Notes (Signed)
Patient Care Team: Rusty Aus, MD as PCP - General (Internal Medicine)   HPI  Jose [name] is a 79 y.o. 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  Sleeps terribly does not have nocturnal dyspnea orthopnea however.  Trace peripheral edema.  No chest discomfort with exertion.  Complaints of neuropathy.  He acknowledges alcohol intake of 4-5 ounces of bourbon per night.      Records and Results Reviewed   Past Medical History:  Diagnosis Date  . Coronary artery disease 2002   RCA stent, 2002. 50-60% stenosis of proximal LAD, 75% stenosis OM2, patent stent mid RCA by cardiac catheterization, 04/17/07  . Headache    hx of migraines - went away in his 36's  . 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 Hypercholesteremia   . Hypertension   . Hypothyroidism   . MI (myocardial infarction) ([place])   . Pacemaker     Past Surgical History:  Procedure Laterality Date  . CARDIAC CATHETERIZATION    . CARDIAC CATHETERIZATION Left 09/12/2015   Procedure: Left Heart Cath and Coronary Angiography;  Surgeon: Minna Merritts, MD;  Location: [place] [facility];  Service: Cardiovascular;  Laterality: Left;  . CARPAL TUNNEL RELEASE Bilateral   . CORONARY ANGIOPLASTY WITH STENT PLACEMENT    . CORONARY ARTERY BYPASS GRAFT N/A 09/23/2015   Procedure: CORONARY ARTERY BYPASS GRAFTING (CABG), ON PUMP, TIMES FOUR, USING LEFT INTERNAL MAMMARY ARTERY, RIGHT GREATER SAPHENOUS VEIN HARVESTED ENDOSCOPICALLY;  Surgeon: Gaye Pollack, MD;  Location: [place];  Service: Open Heart Surgery;  Laterality: N/A;  . HAMMER  TOE SURGERY    . INSERT / REPLACE / REMOVE PACEMAKER  02/25/2015   Serial # [id number] H Model # A2DR01/ DDD  . LOBECTOMY     upper right   . SHOULDER SURGERY Right    replacement  . TEE WITHOUT CARDIOVERSION N/A 09/23/2015   Procedure: TRANSESOPHAGEAL ECHOCARDIOGRAM (TEE);  Surgeon: Gaye Pollack, MD;  Location: [place];  Service: Open Heart Surgery;  Laterality: N/A;  . TOTAL HIP ARTHROPLASTY Right 06/14/2016   Procedure: TOTAL HIP ARTHROPLASTY;  Surgeon: Dereck Leep, MD;  Location: [facility];  Service: Orthopedics;  Laterality: Right;  . TOTAL KNEE ARTHROPLASTY     right    Current Outpatient Medications  Medication Sig Dispense Refill  . aspirin 81 MG EC tablet Take 1 tablet (81 mg total) by mouth daily.    Marland Kitchen etodolac (LODINE) 400 MG tablet Take 400 mg by mouth daily.     Marland Kitchen gabapentin (NEURONTIN) 300 MG capsule Take 300 mg by mouth 2 (two) times daily.     Marland Kitchen latanoprost (XALATAN) 0.005 % ophthalmic solution Place 1 drop into the right eye at bedtime.     Marland Kitchen levothyroxine (SYNTHROID, LEVOTHROID) 25 MCG tablet Take 25 mcg by mouth daily before breakfast.     . meloxicam (MOBIC) 15 MG tablet Take 15 mg by mouth daily.    Marland Kitchen  metoprolol succinate (TOPROL XL) 25 MG 24 hr tablet Take 1 tablet (25 mg total) by mouth daily. 90 tablet 3  . Multiple Vitamins-Minerals (MULTIVITAMIN WITH MINERALS) tablet Take 1 tablet by mouth daily.    . simvastatin (ZOCOR) 40 MG tablet TAKE 1 TABLET BY MOUTH DAILY AT 6PM 90 tablet 2  . vitamin B-12 (CYANOCOBALAMIN) 1000 MCG tablet Take 1,000 mcg by mouth daily.     No current facility-administered medications for this visit.    Allergies  Allergen Reactions  . Amoxicillin Nausea Only  . Amoxicillin-Pot Clavulanate Other (See Comments) and Nausea Only    Patient reports he took oral Augmentin after a foot surgery, and it caused severe nausea; however reports that he can take 'plain' amoxicillin; he takes amoxicillin prior to any dentist procedures, and has on  hand Patient reports he took oral Augmentin after a foot surgery, and it caused severe nausea; however reports that he can take 'plain' amoxicillin; he takes amoxicillin prior to any dentist procedures, and has on hand Patient reports he took oral Augmentin after a foot surgery, and it caused severe nausea; however reports that he can take 'plain' amoxicillin; he takes amoxicillin prior to any dentist procedures, and has on hand       Review of Systems negative except from HPI and PMH  Physical Exam BP (!) 120/58 (BP Location: Left Arm, Patient Position: Sitting, Cuff Size: Normal)   Pulse 68   Ht _0  (1.88 m)   Wt 235 lb 4 oz (106.7 kg)   SpO2 97%   BMI 30.20 kg/m  Well developed and well nourished in no acute distress HENT normal Neck supple with JVP-flat Clear Device pocket well healed; without hematoma or erythema.  There is no tethering  Regular rate and rhythm, no murmur Abd-soft with active BS No Clubbing cyanosis   edema Skin-warm and dry A & Oriented  Grossly normal sensory and motor function  ECG AV pacing at 68 with frequent PVCs   Assessment and  Plan  Complete heart block   Pacemaker-Medtronic  Dyspnea on exertion  Coronary artery disease with prior stenting and interval bypass surgery  Atrial fibrillation-nonsustained  PVCs (0.8% V sensed events)  Alcohol intake-exuberant  Peripheral neuropathy  Dyspnea on exertion could be multifactorial.  He is paced and has been for about 5 years; this is associated with about a 50% incidence of pacemaker induced cardiomyopathy.  We will check LV function.  We addressed first whether he would be amenable to device upgrade (see below) in the event that this is unrevealing would undertake Myoview scanning.  Also discussed alcohol intake both as a cardiomyopathic agent as well as a neuropathic agent.  We reviewed up-to-date together.  There are data to suggest alcohol abstinence over 12-33 months can be  associated with improvement of alcohol peripheral neuropathy.  He will weigh as to whether it is worth it.  He then commented on the fact that he was old and did not expect to live a long time.  I reviewed with him actuarial data wherein the mean life expectancy for someone his age is about 8 more years.   We spent more than 50% of our >45  min visit in face to face counseling regarding the above     Current medicines are reviewed at length with the patient today .  The patient does have concerns regarding medicines.

## 2020-02-14 LAB — CUP PACEART INCLINIC DEVICE CHECK
Battery Remaining Longevity: 43 mo
Battery Voltage: 2.98 V
Brady Statistic AP VP Percent: 20.23 %
Brady Statistic AP VS Percent: 0.04 %
Brady Statistic AS VP Percent: 79.04 %
Brady Statistic AS VS Percent: 0.69 %
Brady Statistic RA Percent Paced: 20.1 %
Brady Statistic RV Percent Paced: 98.76 %
Date Time Interrogation Session: 20210511114200
Implantable Lead Implant Date: 20160531
Implantable Lead Implant Date: 20160531
Implantable Lead Location: 753859
Implantable Lead Location: 753860
Implantable Lead Model: 5076
Implantable Lead Model: 5076
Implantable Pulse Generator Implant Date: 20160531
Lead Channel Impedance Value: 323 Ohm
Lead Channel Impedance Value: 342 Ohm
Lead Channel Impedance Value: 380 Ohm
Lead Channel Impedance Value: 437 Ohm
Lead Channel Pacing Threshold Amplitude: 0.75 V
Lead Channel Pacing Threshold Amplitude: 1.25 V
Lead Channel Pacing Threshold Pulse Width: 0.4 ms
Lead Channel Pacing Threshold Pulse Width: 0.4 ms
Lead Channel Sensing Intrinsic Amplitude: 1.25 mV
Lead Channel Sensing Intrinsic Amplitude: 9.5 mV
Lead Channel Setting Pacing Amplitude: 2.5 V
Lead Channel Setting Pacing Amplitude: 2.5 V
Lead Channel Setting Pacing Pulse Width: 0.4 ms
Lead Channel Setting Sensing Sensitivity: 1.2 mV

## 2020-03-13 ENCOUNTER — Other Ambulatory Visit: Payer: Self-pay

## 2020-03-13 ENCOUNTER — Ambulatory Visit (INDEPENDENT_AMBULATORY_CARE_PROVIDER_SITE_OTHER): Payer: Medicare PPO

## 2020-03-13 DIAGNOSIS — R0609 Other forms of dyspnea: Secondary | ICD-10-CM | POA: Diagnosis not present

## 2020-03-13 DIAGNOSIS — I429 Cardiomyopathy, unspecified: Secondary | ICD-10-CM

## 2020-03-18 ENCOUNTER — Telehealth: Payer: Self-pay | Admitting: Internal Medicine

## 2020-03-18 NOTE — Telephone Encounter (Signed)
I spoke with Dr. Graciela Husbands prior to calling the patient.  Discussed that per his 02/05/20 he had stated: Dyspnea on exertion could be multifactorial.  He is paced and has been for about 5 years; this is associated with about a 50% incidence of pacemaker induced cardiomyopathy.  We will check LV function.  We addressed first whether he would be amenable to device upgrade (see below) in the event that this is unrevealing would undertake Myoview scanning.  I advised I wanted to clarify with him if he would still like the patient to undergo myoview scanning in light of his normal EF. Per Dr. Graciela Husbands, he would recommend myoview scanning at this time due to DOE.   I have called and spoken with the patient about his echo results and Dr. Odessa Fleming recommendations for myoview scanning.  Per the patient, he would like to think about stress testing prior to scheduling. He states he is due for a physical with his PCP in ~ 2 weeks and would like to discuss with him further prior to scheduling.   I have advised the patient that he may call us back at any time if he decides he would like to proceed with stress testing and we can arrange this for him. The patient voices understanding and is agreeable.

## 2020-03-18 NOTE — Telephone Encounter (Signed)
Duke Salvia, MD  03/15/2020 1:22 PM EDT     Please Inform Patient Echo showed near normal and largely stable heart muscle function  55-60>>50-55% maybe essentially unchanged but will continue to monitor

## 2020-07-02 ENCOUNTER — Other Ambulatory Visit: Payer: Self-pay | Admitting: Cardiovascular Disease

## 2020-07-02 MED ORDER — METOPROLOL SUCCINATE ER 25 MG PO TB24: ORAL_TABLET | ORAL | 1 refills | Status: AC

## 2020-07-02 NOTE — Addendum Note (Signed)
Addended by: Theola Sequin on: 07/02/2020 04:55 PM   Modules accepted: Orders

## 2020-07-22 ENCOUNTER — Ambulatory Visit (INDEPENDENT_AMBULATORY_CARE_PROVIDER_SITE_OTHER): Payer: Medicare PPO

## 2020-07-22 DIAGNOSIS — I495 Sick sinus syndrome: Secondary | ICD-10-CM | POA: Diagnosis not present

## 2020-07-22 LAB — CUP PACEART REMOTE DEVICE CHECK
Battery Remaining Longevity: 36 mo
Battery Voltage: 2.97 V
Brady Statistic AP VP Percent: 47.58 %
Brady Statistic AP VS Percent: 0.18 %
Brady Statistic AS VP Percent: 51.47 %
Brady Statistic AS VS Percent: 0.76 %
Brady Statistic RA Percent Paced: 47.1 %
Brady Statistic RV Percent Paced: 98.1 %
Date Time Interrogation Session: 20211026103417
Implantable Lead Implant Date: 20160531
Implantable Lead Implant Date: 20160531
Implantable Lead Location: 753859
Implantable Lead Location: 753860
Implantable Lead Model: 5076
Implantable Lead Model: 5076
Implantable Pulse Generator Implant Date: 20160531
Lead Channel Impedance Value: 304 Ohm
Lead Channel Impedance Value: 342 Ohm
Lead Channel Impedance Value: 361 Ohm
Lead Channel Impedance Value: 456 Ohm
Lead Channel Pacing Threshold Amplitude: 0.75 V
Lead Channel Pacing Threshold Amplitude: 1.625 V
Lead Channel Pacing Threshold Pulse Width: 0.4 ms
Lead Channel Pacing Threshold Pulse Width: 0.4 ms
Lead Channel Sensing Intrinsic Amplitude: 0.625 mV
Lead Channel Sensing Intrinsic Amplitude: 0.625 mV
Lead Channel Sensing Intrinsic Amplitude: 22.75 mV
Lead Channel Sensing Intrinsic Amplitude: 22.75 mV
Lead Channel Setting Pacing Amplitude: 2.5 V
Lead Channel Setting Pacing Amplitude: 2.5 V
Lead Channel Setting Pacing Pulse Width: 0.4 ms
Lead Channel Setting Sensing Sensitivity: 1.2 mV

## 2020-07-24 NOTE — Progress Notes (Signed)
Remote pacemaker transmission.   

## 2020-10-30 ENCOUNTER — Ambulatory Visit (INDEPENDENT_AMBULATORY_CARE_PROVIDER_SITE_OTHER): Payer: Medicare PPO

## 2020-10-30 DIAGNOSIS — I495 Sick sinus syndrome: Secondary | ICD-10-CM

## 2020-10-30 DIAGNOSIS — I442 Atrioventricular block, complete: Secondary | ICD-10-CM

## 2020-10-30 LAB — CUP PACEART REMOTE DEVICE CHECK
Battery Remaining Longevity: 36 mo
Battery Voltage: 2.96 V
Brady Statistic AP VP Percent: 43.38 %
Brady Statistic AP VS Percent: 0.15 %
Brady Statistic AS VP Percent: 55.68 %
Brady Statistic AS VS Percent: 0.79 %
Brady Statistic RA Percent Paced: 42.93 %
Brady Statistic RV Percent Paced: 98.13 %
Date Time Interrogation Session: 20220202163619
Implantable Lead Implant Date: 20160531
Implantable Lead Implant Date: 20160531
Implantable Lead Location: 753859
Implantable Lead Location: 753860
Implantable Lead Model: 5076
Implantable Lead Model: 5076
Implantable Pulse Generator Implant Date: 20160531
Lead Channel Impedance Value: 323 Ohm
Lead Channel Impedance Value: 361 Ohm
Lead Channel Impedance Value: 361 Ohm
Lead Channel Impedance Value: 513 Ohm
Lead Channel Pacing Threshold Amplitude: 0.875 V
Lead Channel Pacing Threshold Amplitude: 1.75 V
Lead Channel Pacing Threshold Pulse Width: 0.4 ms
Lead Channel Pacing Threshold Pulse Width: 0.4 ms
Lead Channel Sensing Intrinsic Amplitude: 0.875 mV
Lead Channel Sensing Intrinsic Amplitude: 0.875 mV
Lead Channel Sensing Intrinsic Amplitude: 15 mV
Lead Channel Sensing Intrinsic Amplitude: 15 mV
Lead Channel Setting Pacing Amplitude: 2.5 V
Lead Channel Setting Pacing Amplitude: 2.5 V
Lead Channel Setting Pacing Pulse Width: 0.4 ms
Lead Channel Setting Sensing Sensitivity: 1.2 mV

## 2020-11-05 NOTE — Progress Notes (Signed)
Remote pacemaker transmission.   

## 2021-04-14 ENCOUNTER — Encounter: Payer: Self-pay | Admitting: Internal Medicine

## 2021-04-14 ENCOUNTER — Ambulatory Visit (INDEPENDENT_AMBULATORY_CARE_PROVIDER_SITE_OTHER): Payer: Medicare PPO | Admitting: Internal Medicine

## 2021-04-14 ENCOUNTER — Other Ambulatory Visit: Payer: Self-pay

## 2021-04-14 DIAGNOSIS — I495 Sick sinus syndrome: Secondary | ICD-10-CM

## 2021-04-14 DIAGNOSIS — I442 Atrioventricular block, complete: Secondary | ICD-10-CM

## 2021-04-14 LAB — PACEMAKER DEVICE OBSERVATION

## 2021-04-14 NOTE — Progress Notes (Signed)
Patient ID: Jose [name], male   DOB: 1940/10/14, 80 y.o.   MRN: [id number]      Patient Care Team: Rusty Aus, MD as PCP - General (Internal Medicine)   HPI  Jose [name] is a 80 y.o. 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  He acknowledges alcohol intake of 4-5 ounces of bourbon per night.He has reduce the ounces of alcohol he drinks at night    Today, the patient denies chest pain, nocturnal dyspnea, orthopnea or peripheral edema.  There have been no palpitations, lightheadedness or syncope.  Complains of Dyspnea assoc with exertion,  >100 yds.   . He notes he gets a lot of sleep but do not sleep well overall. He do snore at night but do not uses a CPAP machine     His neuropathy has not improved and states he has to walk slow  DATE TEST EF   12/16 Echo   40-45 %   3/18 Echo   55-60 %   06/21 Echo 50-55%          Date Cr K Hgb  12/20 0.8 4.6 15.4  7/21 1.0 4.4 14.5  3/22 0.9 4.6 15.0     Records and Results Reviewed   Past Medical History:  Diagnosis Date   Coronary artery disease 2002   RCA stent, 2002. 50-60% stenosis of proximal LAD, 75% stenosis OM2, patent stent mid RCA by cardiac catheterization, 04/17/07   Headache    hx of migraines - went away in his 18's   History of lobectomy of lung 2014   right side (non cancerous)   Hypercholesteremia    Hypertension    Hypothyroidism    MI (myocardial infarction) [facility])    Pacemaker     Past Surgical History:  Procedure Laterality Date   CARDIAC CATHETERIZATION     CARDIAC CATHETERIZATION Left 09/12/2015   Procedure: Left Heart Cath and Coronary Angiography;  Surgeon: Minna Merritts, MD;  Location: [place] [facility];  Service: Cardiovascular;  Laterality: Left;   CARPAL TUNNEL RELEASE Bilateral     CORONARY ANGIOPLASTY WITH STENT PLACEMENT     CORONARY ARTERY BYPASS GRAFT N/A 09/23/2015   Procedure: CORONARY ARTERY BYPASS GRAFTING (CABG), ON PUMP, TIMES FOUR, USING LEFT INTERNAL MAMMARY ARTERY, RIGHT GREATER SAPHENOUS VEIN HARVESTED ENDOSCOPICALLY;  Surgeon: Gaye Pollack, MD;  Location: [place];  Service: Open Heart Surgery;  Laterality: N/A;   HAMMER TOE SURGERY     INSERT / REPLACE / REMOVE PACEMAKER  02/25/2015   Serial # [id number] H Model # A2DR01/ DDD   LOBECTOMY     upper right    SHOULDER SURGERY Right    replacement   TEE WITHOUT CARDIOVERSION N/A 09/23/2015   Procedure: TRANSESOPHAGEAL ECHOCARDIOGRAM (TEE);  Surgeon: Gaye Pollack, MD;  Location: [place];  Service: Open Heart Surgery;  Laterality: N/A;   TOTAL HIP ARTHROPLASTY Right 06/14/2016   Procedure: TOTAL HIP ARTHROPLASTY;  Surgeon: Dereck Leep, MD;  Location: [facility];  Service: Orthopedics;  Laterality: Right;   TOTAL KNEE ARTHROPLASTY     right    Current Outpatient Medications  Medication Sig Dispense Refill   traZODone (DESYREL) 50 MG tablet Take by mouth.     aspirin 81 MG EC tablet Take 1 tablet (81  mg total) by mouth daily.     gabapentin (NEURONTIN) 300 MG capsule Take 300 mg by mouth 2 (two) times daily.      latanoprost (XALATAN) 0.005 % ophthalmic solution Place 1 drop into the right eye at bedtime.      levothyroxine (SYNTHROID, LEVOTHROID) 25 MCG tablet Take 25 mcg by mouth daily before breakfast.      meloxicam (MOBIC) 15 MG tablet Take 15 mg by mouth daily.     metoprolol succinate (TOPROL-XL) 25 MG 24 hr tablet TAKE 1 TABLET(25 MG) BY MOUTH DAILY 90 tablet 1   Multiple Vitamins-Minerals (MULTIVITAMIN WITH MINERALS) tablet Take 1 tablet by mouth daily.     simvastatin (ZOCOR) 40 MG tablet TAKE 1 TABLET BY MOUTH DAILY AT 6PM 90 tablet 2   vitamin B-12 (CYANOCOBALAMIN) 1000 MCG tablet Take 1,000 mcg by mouth daily.     No current facility-administered medications for this visit.    Allergies   Allergen Reactions   Amoxicillin Nausea Only   Amoxicillin-Pot Clavulanate Other (See Comments) and Nausea Only    Patient reports he took oral Augmentin after a foot surgery, and it caused severe nausea; however reports that he can take 'plain' amoxicillin; he takes amoxicillin prior to any dentist procedures, and has on hand Patient reports he took oral Augmentin after a foot surgery, and it caused severe nausea; however reports that he can take 'plain' amoxicillin; he takes amoxicillin prior to any dentist procedures, and has on hand Patient reports he took oral Augmentin after a foot surgery, and it caused severe nausea; however reports that he can take 'plain' amoxicillin; he takes amoxicillin prior to any dentist procedures, and has on hand     Review of Systems negative except from HPI and PMH  Physical Exam: BP 140/72 (BP Location: Left Arm, Patient Position: Sitting, Cuff Size: Normal)   Pulse 69   Ht _0  (1.88 m)   Wt 232 lb (105.2 kg)   SpO2 95%   BMI 29.79 kg/m  Well developed and well nourished in no acute distress HENT normal Neck supple with JVP-flat Lungs Clear Device pocket well healed; without hematoma or erythema.  There is no tethering  Regular rate and rhythm, No  gallop No murmur Abd-soft with active BS No Clubbing cyanosis tr edema Skin-warm and dry A & Oriented  Grossly normal sensory and motor function  ECG : AV pacing @ 69\   Assessment and  Plan:  Complete heart block    Pacemaker-Medtronic  Dyspnea on exertion   Coronary artery disease with prior stenting and interval bypass surgery   Atrial fibrillation-nonsustained  PVCs (0.8% V sensed events)  Alcohol intake-exuberant  Peripheral neuropathy   Fewer complaints of dyspnea on exertion.  Currently euvolemic.  Device detections of atrial fibrillation are lacking sensitivity because of under sensing.  Reviewed this with him and have reprogrammed his sensitivity from 0.3--0.15  mV.  No ischemia.  We will continue him on aspirin and metoprolol.  Continue him on simvastatin 40; last LDL was at target at 42  Working to decrease his alcohol intake   I,Stephanie Williams,acting as a scribe for Virl Axe, MD.,have documented all relevant documentation on the behalf of Virl Axe, MD,as directed by  Virl Axe, MD while in the presence of Virl Axe, MD.  I, Virl Axe, MD, have reviewed all documentation for this visit. The documentation on 04/14/21 for the exam, diagnosis, procedures, and orders are all accurate and complete.

## 2021-04-14 NOTE — Patient Instructions (Signed)
Medication Instructions:  Your physician recommends that you continue on your current medications as directed. Please refer to the Current Medication list given to you today.  *If you need a refill on your cardiac medications before your next appointment, please call your pharmacy*   Lab Work: None ordered.  If you have labs (blood work) drawn today and your tests are completely normal, you will receive your results only by: . MyChart Message (if you have MyChart) OR . A paper copy in the mail If you have any lab test that is abnormal or we need to change your treatment, we will call you to review the results.   Testing/Procedures: None ordered.    Follow-Up: At [facility], you and your health needs are our priority.  As part of our continuing mission to provide you with exceptional heart care, we have created designated Provider Care Teams.  These Care Teams include your primary Cardiologist (physician) and Advanced Practice Providers (APPs -  Physician Assistants and Nurse Practitioners) who all work together to provide you with the care you need, when you need it.  We recommend signing up for the patient portal called "MyChart".  Sign up information is provided on this After Visit Summary.  MyChart is used to connect with patients for Virtual Visits (Telemedicine).  Patients are able to view lab/test results, encounter notes, upcoming appointments, etc.  Non-urgent messages can be sent to your provider as well.   To learn more about what you can do with MyChart, go to [email or web address].    Your next appointment:   4 month(s)  The format for your next appointment:   In Person  Provider:   Steven Klein, MD     

## 2021-04-30 ENCOUNTER — Ambulatory Visit (INDEPENDENT_AMBULATORY_CARE_PROVIDER_SITE_OTHER): Payer: Medicare PPO

## 2021-04-30 DIAGNOSIS — I442 Atrioventricular block, complete: Secondary | ICD-10-CM

## 2021-05-04 LAB — CUP PACEART REMOTE DEVICE CHECK
Battery Remaining Longevity: 26 mo
Battery Voltage: 2.95 V
Brady Statistic AP VP Percent: 44.75 %
Brady Statistic AP VS Percent: 0.01 %
Brady Statistic AS VP Percent: 55.19 %
Brady Statistic AS VS Percent: 0.05 %
Brady Statistic RA Percent Paced: 44.33 %
Brady Statistic RV Percent Paced: 99.89 %
Date Time Interrogation Session: 20220804182839
Implantable Lead Implant Date: 20160531
Implantable Lead Implant Date: 20160531
Implantable Lead Location: 753859
Implantable Lead Location: 753860
Implantable Lead Model: 5076
Implantable Lead Model: 5076
Implantable Pulse Generator Implant Date: 20160531
Lead Channel Impedance Value: 323 Ohm
Lead Channel Impedance Value: 361 Ohm
Lead Channel Impedance Value: 380 Ohm
Lead Channel Impedance Value: 494 Ohm
Lead Channel Pacing Threshold Amplitude: 1 V
Lead Channel Pacing Threshold Amplitude: 1.75 V
Lead Channel Pacing Threshold Pulse Width: 0.4 ms
Lead Channel Pacing Threshold Pulse Width: 0.4 ms
Lead Channel Sensing Intrinsic Amplitude: 0.75 mV
Lead Channel Sensing Intrinsic Amplitude: 0.75 mV
Lead Channel Sensing Intrinsic Amplitude: 13.625 mV
Lead Channel Sensing Intrinsic Amplitude: 13.625 mV
Lead Channel Setting Pacing Amplitude: 2.5 V
Lead Channel Setting Pacing Amplitude: 2.5 V
Lead Channel Setting Pacing Pulse Width: 0.4 ms
Lead Channel Setting Sensing Sensitivity: 1.2 mV

## 2021-05-22 NOTE — Progress Notes (Signed)
Remote pacemaker transmission.   

## 2021-05-25 ENCOUNTER — Ambulatory Visit: Payer: Medicare PPO | Admitting: Cardiovascular Disease

## 2021-07-28 ENCOUNTER — Encounter: Payer: Medicare PPO | Admitting: Internal Medicine

## 2021-07-30 ENCOUNTER — Ambulatory Visit (INDEPENDENT_AMBULATORY_CARE_PROVIDER_SITE_OTHER): Payer: Medicare PPO

## 2021-07-30 DIAGNOSIS — I442 Atrioventricular block, complete: Secondary | ICD-10-CM

## 2021-08-04 LAB — CUP PACEART REMOTE DEVICE CHECK
Battery Remaining Longevity: 24 mo
Battery Voltage: 2.94 V
Brady Statistic AP VP Percent: 38.38 %
Brady Statistic AP VS Percent: 0.16 %
Brady Statistic AS VP Percent: 60.69 %
Brady Statistic AS VS Percent: 0.77 %
Brady Statistic RA Percent Paced: 38.26 %
Brady Statistic RV Percent Paced: 98.86 %
Date Time Interrogation Session: 20221108150632
Implantable Lead Implant Date: 20160531
Implantable Lead Implant Date: 20160531
Implantable Lead Location: 753859
Implantable Lead Location: 753860
Implantable Lead Model: 5076
Implantable Lead Model: 5076
Implantable Pulse Generator Implant Date: 20160531
Lead Channel Impedance Value: 323 Ohm
Lead Channel Impedance Value: 361 Ohm
Lead Channel Impedance Value: 361 Ohm
Lead Channel Impedance Value: 475 Ohm
Lead Channel Pacing Threshold Amplitude: 0.75 V
Lead Channel Pacing Threshold Amplitude: 1.625 V
Lead Channel Pacing Threshold Pulse Width: 0.4 ms
Lead Channel Pacing Threshold Pulse Width: 0.4 ms
Lead Channel Sensing Intrinsic Amplitude: 0.75 mV
Lead Channel Sensing Intrinsic Amplitude: 0.75 mV
Lead Channel Sensing Intrinsic Amplitude: 12.125 mV
Lead Channel Sensing Intrinsic Amplitude: 12.125 mV
Lead Channel Setting Pacing Amplitude: 2.5 V
Lead Channel Setting Pacing Amplitude: 2.5 V
Lead Channel Setting Pacing Pulse Width: 0.4 ms
Lead Channel Setting Sensing Sensitivity: 1.2 mV

## 2021-08-05 NOTE — Progress Notes (Signed)
Remote pacemaker transmission.   

## 2021-09-01 ENCOUNTER — Other Ambulatory Visit: Payer: Self-pay

## 2021-09-01 ENCOUNTER — Encounter: Payer: Self-pay | Admitting: Emergency Medicine

## 2021-09-01 ENCOUNTER — Emergency Department: Payer: Medicare PPO

## 2021-09-01 ENCOUNTER — Ambulatory Visit: Payer: Self-pay

## 2021-09-01 ENCOUNTER — Emergency Department
Admission: EM | Admit: 2021-09-01 | Discharge: 2021-09-02 | Disposition: A | Discharge status: Home / Self Care | Payer: Medicare PPO | Attending: Emergency Medicine | Admitting: Emergency Medicine

## 2021-09-01 DIAGNOSIS — Z96611 Presence of right artificial shoulder joint: Secondary | ICD-10-CM | POA: Insufficient documentation

## 2021-09-01 DIAGNOSIS — U071 COVID-19: Secondary | ICD-10-CM | POA: Diagnosis not present

## 2021-09-01 DIAGNOSIS — Z87891 Personal history of nicotine dependence: Secondary | ICD-10-CM | POA: Insufficient documentation

## 2021-09-01 DIAGNOSIS — R059 Cough, unspecified: Secondary | ICD-10-CM | POA: Diagnosis present

## 2021-09-01 DIAGNOSIS — Z96651 Presence of right artificial knee joint: Secondary | ICD-10-CM | POA: Insufficient documentation

## 2021-09-01 DIAGNOSIS — E039 Hypothyroidism, unspecified: Secondary | ICD-10-CM | POA: Insufficient documentation

## 2021-09-01 DIAGNOSIS — Z7982 Long term (current) use of aspirin: Secondary | ICD-10-CM | POA: Diagnosis not present

## 2021-09-01 DIAGNOSIS — I10 Essential (primary) hypertension: Secondary | ICD-10-CM | POA: Insufficient documentation

## 2021-09-01 DIAGNOSIS — I4891 Unspecified atrial fibrillation: Secondary | ICD-10-CM | POA: Insufficient documentation

## 2021-09-01 DIAGNOSIS — Z79899 Other long term (current) drug therapy: Secondary | ICD-10-CM | POA: Diagnosis not present

## 2021-09-01 DIAGNOSIS — I2510 Atherosclerotic heart disease of native coronary artery without angina pectoris: Secondary | ICD-10-CM | POA: Diagnosis not present

## 2021-09-01 DIAGNOSIS — L03312 Cellulitis of back [any part except buttock]: Secondary | ICD-10-CM | POA: Insufficient documentation

## 2021-09-01 DIAGNOSIS — Z951 Presence of aortocoronary bypass graft: Secondary | ICD-10-CM | POA: Insufficient documentation

## 2021-09-01 DIAGNOSIS — L0390 Cellulitis, unspecified: Secondary | ICD-10-CM

## 2021-09-01 DIAGNOSIS — Z96641 Presence of right artificial hip joint: Secondary | ICD-10-CM | POA: Insufficient documentation

## 2021-09-01 LAB — CBC
HCT: 44.5 % (ref 39.0–52.0)
Hemoglobin: 14.9 g/dL (ref 13.0–17.0)
MCH: 33.5 pg (ref 26.0–34.0)
MCHC: 33.5 g/dL (ref 30.0–36.0)
MCV: 100 fL (ref 80.0–100.0)
Platelets: 172 10*3/uL (ref 150–400)
RBC: 4.45 MIL/uL (ref 4.22–5.81)
RDW: 13.1 % (ref 11.5–15.5)
WBC: 7.2 10*3/uL (ref 4.0–10.5)
nRBC: 0 % (ref 0.0–0.2)

## 2021-09-01 LAB — BASIC METABOLIC PANEL
Anion gap: 8 (ref 5–15)
BUN: 17 mg/dL (ref 8–23)
CO2: 25 mmol/L (ref 22–32)
Calcium: 9 mg/dL (ref 8.9–10.3)
Chloride: 100 mmol/L (ref 98–111)
Creatinine, Ser: 0.79 mg/dL (ref 0.61–1.24)
GFR, Estimated: >60 mL/min (ref >60)
Glucose, Bld: 114 mg/dL — ABNORMAL HIGH (ref 70–99)
Potassium: 4.1 mmol/L (ref 3.5–5.1)
Sodium: 133 mmol/L — ABNORMAL LOW (ref 135–145)

## 2021-09-01 LAB — TROPONIN I (HIGH SENSITIVITY): Troponin I (High Sensitivity): 8 ng/L (ref <18)

## 2021-09-01 MED ORDER — NIRMATRELVIR/RITONAVIR (PAXLOVID)TABLET
3.0000 | ORAL_TABLET | Freq: Two times a day (BID) | ORAL | Status: DC
  Administered 2021-09-01: 3 via ORAL
  Filled 2021-09-01: qty 30

## 2021-09-01 MED ORDER — GUAIFENESIN-CODEINE 100-10 MG/5ML PO SYRP: 5.0000 mL | ORAL_SOLUTION | Freq: Every evening | ORAL | 0 refills | Status: AC | PRN

## 2021-09-01 MED ORDER — BENZONATATE 100 MG PO CAPS: 100.0000 mg | ORAL_CAPSULE | Freq: Three times a day (TID) | ORAL | 0 refills | Status: AC | PRN

## 2021-09-01 MED ORDER — ACETAMINOPHEN 500 MG PO TABS
1000.0000 mg | ORAL_TABLET | Freq: Once | ORAL | Status: AC
  Administered 2021-09-01: 1000 mg via ORAL
  Filled 2021-09-01: qty 2

## 2021-09-01 MED ORDER — CEPHALEXIN 500 MG PO CAPS: 500.0000 mg | ORAL_CAPSULE | Freq: Two times a day (BID) | ORAL | 0 refills | Status: AC

## 2021-09-01 MED ORDER — ONDANSETRON HCL 4 MG PO TABS: 4.0000 mg | ORAL_TABLET | Freq: Every day | ORAL | 0 refills | Status: AC | PRN

## 2021-09-01 NOTE — ED Provider Notes (Addendum)
[facility] Emergency Department Provider Note  ____________________________________________   Event Date/Time   First MD Initiated Contact with Patient 09/01/21 2138     (approximate)  I have reviewed the triage vital signs    HISTORY  Chief Complaint Cough, Fever, and Chest Pain    HPI Jose [name] is a 80 y.o. male who presents with viral symptoms.  Patient reports testing positive for COVID with an at home test.  He does report a little bit of shortness of breath, coughing and some chest discomfort that started earlier this morning.  Patient reports coming in due to him having a high risk medical history.  He has had a stent, pacemaker, diabetes.  He denies any abdominal pain, leg swelling or any other concerns.  Symptoms are constant, started yesterday, nothing makes it better but worse with coughing.      Past Medical History:  Diagnosis Date   Coronary artery disease 2002   RCA stent, 2002. 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   Angina pectoris (HCC)    CAD (coronary artery disease) 06/25/2015   Atrial fibrillation ([place]) 06/10/2015   Obstructive sleep apnea 06/10/2015   Coronary artery disease involving native coronary artery of native heart without angina pectoris 05/02/2015   Pacemaker 05/02/2015   Hyperlipidemia 05/02/2015   History of coronary artery stent placement 05/02/2015   Sick sinus syndrome ([place]) 05/02/2015    Past Surgical History:  Procedure Laterality Date   CARDIAC  CATHETERIZATION     CARDIAC CATHETERIZATION Left 09/12/2015   Procedure: Left Heart Cath and Coronary Angiography;  Surgeon: Minna Merritts, MD;  Location: [place] [facility];  Service: Cardiovascular;  Laterality: Left;   CARPAL TUNNEL RELEASE Bilateral    CORONARY ANGIOPLASTY WITH STENT PLACEMENT     CORONARY ARTERY BYPASS GRAFT N/A 09/23/2015   Procedure: CORONARY ARTERY BYPASS GRAFTING (CABG), ON PUMP, TIMES FOUR, USING LEFT INTERNAL MAMMARY ARTERY, RIGHT GREATER SAPHENOUS VEIN HARVESTED ENDOSCOPICALLY;  Surgeon: Gaye Pollack, MD;  Location: [place];  Service: Open Heart Surgery;  Laterality: N/A;   HAMMER TOE SURGERY     INSERT / REPLACE / REMOVE PACEMAKER  02/25/2015   Serial # [id number] H Model # A2DR01/ DDD   LOBECTOMY     upper right    SHOULDER SURGERY Right    replacement   TEE WITHOUT CARDIOVERSION N/A 09/23/2015   Procedure: TRANSESOPHAGEAL ECHOCARDIOGRAM (TEE);  Surgeon: Gaye Pollack, MD;  Location: [place];  Service: Open Heart Surgery;  Laterality: N/A;   TOTAL HIP ARTHROPLASTY Right 06/14/2016   Procedure: TOTAL HIP ARTHROPLASTY;  Surgeon: Dereck Leep, MD;  Location: [facility];  Service: Orthopedics;  Laterality: Right;   TOTAL KNEE ARTHROPLASTY     right    Prior to Admission medications   Medication Sig Start Date End Date Taking? Authorizing Provider  aspirin 81 MG EC tablet Take 1 tablet (81 mg total) by mouth daily. 10/07/15  Yes  Minna Merritts, MD  gabapentin (NEURONTIN) 300 MG capsule Take 300 mg by mouth 2 (two) times daily.  07/27/19  Yes [provider]  levothyroxine (SYNTHROID, LEVOTHROID) 25 MCG tablet Take 25 mcg by mouth daily before breakfast.  03/12/15  Yes [provider]  meloxicam (MOBIC) 15 MG tablet Take 15 mg by mouth daily.   Yes [provider]  metoprolol succinate (TOPROL-XL) 25 MG 24 hr tablet TAKE 1 TABLET(25 MG) BY MOUTH DAILY 07/02/20  Yes Gollan, Kathlene November, MD  Multiple Vitamins-Minerals (MULTIVITAMIN WITH  MINERALS) tablet Take 1 tablet by mouth daily.   Yes [provider]  simvastatin (ZOCOR) 40 MG tablet TAKE 1 TABLET BY MOUTH DAILY AT 6PM 11/11/16  Yes Gollan, Kathlene November, MD  temazepam (RESTORIL) 15 MG capsule Take 15 mg by mouth at bedtime as needed. 07/06/21  Yes [provider]  vitamin B-12 (CYANOCOBALAMIN) 1000 MCG tablet Take 1,000 mcg by mouth daily.   Yes [provider]  latanoprost (XALATAN) 0.005 % ophthalmic solution Place 1 drop into the right eye at bedtime.  Patient not taking: Reported on 04/14/2021 05/01/19   [provider]    Allergies Amoxicillin and Amoxicillin-pot clavulanate  Family History  Problem Relation Age of Onset   Heart attack Mother 97    Social History Social History   Tobacco Use   Smoking status: Former    Packs/day: 1.00    Years: 25.00    Pack years: 25.00    Types: Cigarettes    Quit date: 12/22/1990    Years since quitting: 30.7   Smokeless tobacco: Never  Substance Use Topics   Alcohol use: Yes    Alcohol/week: 14.0 standard drinks    Types: 14 Shots of liquor per week   Drug use: No      Review of Systems Constitutional: No fever/chills Eyes: No visual changes. ENT: No sore throat. Cardiovascular: Positive chest pain that started after the coughing Respiratory: Denies severe shortness of breath, cough Gastrointestinal: No abdominal pain.  No nausea, no vomiting.  No diarrhea.  No constipation. Genitourinary: Negative for dysuria. Musculoskeletal: Negative for back pain. Skin: Negative for rash. Neurological: Negative for headaches, focal weakness or numbness. All other ROS negative ____________________________________________   PHYSICAL EXAM:  VITAL SIGNS: ED Triage Vitals  Enc Vitals Group     BP 09/01/21 1915 (!) 147/79     Pulse Rate 09/01/21 1915 87     Resp 09/01/21 1915 20     Temp 09/01/21 1915 99 F (37.2 C)     Temp Source 09/01/21 1915 Oral     SpO2 09/01/21 1915 94 %      Weight 09/01/21 1916 222 lb (100.7 kg)     Height 09/01/21 1916 _0  (1.854 m)     Head Circumference --      Peak Flow --      Pain Score 09/01/21 1916 6     Pain Loc --      Pain Edu? --      Excl. in [place]? --     Constitutional: Alert and oriented. Well appearing and in no acute distress. Eyes: Conjunctivae are normal. EOMI. Head: Atraumatic. Nose: No congestion/rhinnorhea. Mouth/Throat: Mucous membranes are moist.   Neck: No stridor. Trachea Midline. FROM Cardiovascular: Normal rate, regular rhythm. Good peripheral circulation. Respiratory: no audible stridor, no increased work of breathing  Gastrointestinal: Soft and nontender. No distention.  Musculoskeletal: No lower extremity tenderness nor edema.  No joint effusions. Neurologic:  Normal speech and language. No gross focal neurologic deficits are appreciated.  Skin:  Skin is warm, dry and intact.  Psychiatric: Mood and affect are normal. Speech and behavior are normal. GU: Deferred  On his back patient has a small skin tag that was recently removed with a little bit of erythema around it ____________________________________________   LABS (all labs ordered are listed, but only abnormal results are displayed)  Labs Reviewed  BASIC METABOLIC PANEL - Abnormal; Notable for the following components:      Result Value   Sodium 133 (*)    Glucose, Bld 114 (*)    All other components within normal limits  CBC  TROPONIN I (HIGH SENSITIVITY)  TROPONIN I (HIGH SENSITIVITY)   ____________________________________________   RADIOLOGY Robert Bellow, personally viewed and evaluated these images (plain radiographs) as part of my medical decision making, as well as reviewing the written report by the radiologist.  ED MD interpretation: No pneumonia  Official radiology report(s): DG Chest 2 View  Result Date: 09/01/2021 CLINICAL DATA:  Chest pain, cough EXAM: CHEST - 2 VIEW COMPARISON:  10/29/2015 FINDINGS: Pulmonary  interstitial coarsening is likely chronic in nature. No superimposed confluent pulmonary infiltrate. Right apical pleural thickening is unchanged. No pneumothorax or pleural effusion. Median sternotomy has been performed. Cardiac size within normal limits. Left subclavian dual lead pacemaker is unchanged. No acute bone abnormality. Right shoulder arthroplasty partially visualized. IMPRESSION: No active cardiopulmonary disease. Electronically Signed   By: Fidela Salisbury M.D.   On: 09/01/2021 19:31    ____________________________________________   PROCEDURES  Procedure(s) performed (including Critical Care):  .1-3 Lead EKG Interpretation Performed by: Vanessa [place], MD Authorized by: Vanessa [place], MD     Interpretation: normal     ECG rate:  80s   ECG rate assessment: normal     Rhythm: paced     Ectopy: PVCs     Conduction: normal     ____________________________________________   INITIAL IMPRESSION / ASSESSMENT AND PLAN / ED COURSE  Jose [name] was evaluated in Emergency Department on 09/01/2021 for the symptoms described in the history of present illness. He was evaluated in the context of the global COVID-19 pandemic, which necessitated consideration that the patient might be at risk for infection with the SARS-CoV-2 virus that causes COVID-19. Institutional protocols and algorithms that pertain to the evaluation of patients at risk for COVID-19 are in a state of rapid change based on information released by regulatory bodies including the CDC and federal and state organizations. These policies and algorithms were followed during the patient's care in the ED.     EKG my interpretation is atrially sensed ventricular paced rhythm with frequent PVCs.  No evidence of STEMI  This is most consistent with COVID-19.  EKG and cardiac marker was ordered to evaluate for ACS given patient's history.  Labs ordered evaluate for any electrolyte abnormalities, AKI.  Labs are reassuring.  Troponin  is 8 but his pain seems to be all from the coughing.  He reports the pain started this morning that was going on for multiple hours and his troponin is normal.  White count is normal no evidence of severe infection.  He does have a little bit of erythema around the skin tag area does report a little pain there.  Looks like he might have very mild cellulitis.  His fever did spike I suspect this is just from the COVID the patient's fever came down with some Tylenol.  Patient  states that he feels well feels comfortable going home.  I have discussed with pharmacy who reviewed his medication and recommended that he hold his statin and have started him on Paxlovidand given him his course here.  He would also like something to help with sleep and cough.  We will give some codeine cough syrup but explained the risk of this and to only use it at nighttime.  Patient expressed understanding.  He understands to come back if he develops worsening shortness of breath.  Reevaluated patient and he feels comfortable with discharge home and reports only pain when he coughs.   ____________________________________________   FINAL CLINICAL IMPRESSION(S) / ED DIAGNOSES   Final diagnoses:  COVID-19  Cellulitis, unspecified cellulitis site      MEDICATIONS GIVEN DURING THIS VISIT:  Medications  nirmatrelvir/ritonavir EUA (PAXLOVID) 3 tablet (3 tablets Oral Given 09/01/21 2318)  acetaminophen (TYLENOL) tablet 1,000 mg (1,000 mg Oral Given 09/01/21 2317)     ED Discharge Orders          Ordered    cephALEXin (KEFLEX) 500 MG capsule  2 times daily        09/01/21 2332    guaiFENesin-codeine (ROBITUSSIN AC) 100-10 MG/5ML syrup  At bedtime PRN        09/01/21 2332    benzonatate (TESSALON PERLES) 100 MG capsule  3 times daily PRN        09/01/21 2332    ondansetron (ZOFRAN) 4 MG tablet  Daily PRN        09/01/21 2333             Note:  This document was prepared using Dragon voice recognition software  and may include unintentional dictation errors.   Vanessa [place], MD 09/01/21 2505    Vanessa [place], MD 09/01/21 [phone]

## 2021-09-01 NOTE — Telephone Encounter (Signed)
Pt called in, stating he tested positive for COVID 30 mins ago using a home test and is having chest discomfort and SOB and cough. Pt says his symptoms started yesterday evening and had a hard time sleeping d/t the cough. He is at high risk r/t his medical hx. Pt is vaccinated. Wife is present during call. Advised pt to go to ED to be evaluated. Pt states he will be going to [facility] ED and will be there within the hr. Care advice given and pt verbalized understanding. No other questions/concerns noted.    Reason for Disposition  Chest pain or pressure  Answer Assessment - Initial Assessment Questions 1. COVID-19 DIAGNOSIS: "Who made your COVID-19 diagnosis?" "Was it confirmed by a positive lab test or self-test?" If not diagnosed by a doctor (or NP/PA), ask "Are there lots of cases (community spread) where you live?" Note: See public health department website, if unsure.     Home test  2. COVID-19 EXPOSURE: "Was there any known exposure to COVID before the symptoms began?" CDC Definition of close contact: within 6 feet (2 meters) for a total of 15 minutes or more over a 24-hour period.      *No Answer* 3. ONSET: "When did the COVID-19 symptoms start?"      Yesterday  4. WORST SYMPTOM: "What is your worst symptom?" (e.g., cough, fever, shortness of breath, muscle aches)     Cough and chest discomfort 5. COUGH: "Do you have a cough?" If Yes, ask: "How bad is the cough?"       Yes, mucus  6. FEVER: "Do you have a fever?" If Yes, ask: "What is your temperature, how was it measured, and when did it start?"     No 7. RESPIRATORY STATUS: "Describe your breathing?" (e.g., shortness of breath, wheezing, unable to speak)      Chest discomfort and SOB 8. BETTER-SAME-WORSE: "Are you getting better, staying the same or getting worse compared to yesterday?"  If getting worse, ask, "In what way?"     unsure 9. HIGH RISK DISEASE: "Do you have any chronic medical problems?" (e.g., asthma, heart or lung disease,  weak immune system, obesity, etc.)     Bypass lung surgery 10. VACCINE: "Have you had the COVID-19 vaccine?" If Yes, ask: "Which one, how many shots, when did you get it?"       Yes 11. BOOSTER: "Have you received your COVID-19 booster?" If Yes, ask: "Which one and when did you get it?"       Yes 12. PREGNANCY: "Is there any chance you are pregnant?" "When was your last menstrual period?"       NA 13. OTHER SYMPTOMS: "Do you have any other symptoms?"  (e.g., chills, fatigue, headache, loss of smell or taste, muscle pain, sore throat)       Muscle pain 14. O2 SATURATION MONITOR:  "Do you use an oxygen saturation monitor (pulse oximeter) at home?" If Yes, ask "What is your reading (oxygen level) today?" "What is your usual oxygen saturation reading?" (e.g., 95%)       No  Protocols used: Coronavirus (COVID-19) Diagnosed or Suspected-A-AH

## 2021-09-01 NOTE — Discharge Instructions (Addendum)
  Hold the simvastatin going forward and restart 2 days after you have completed the course of paxlovid.   Take Zofran to help with nausea.  Take the antibiotics for the concern for possible infection around the skin tag that was removed on your back.  Use the codeine cough syrup only at nighttime to help with the cough.  Return the ER for worsening shortness of breath or any other concern  Return to the ER for worsening shortness of breath or any other concern

## 2021-09-01 NOTE — ED Triage Notes (Signed)
Pt reports that he developed cough, low grade fever and some chest discomfort that began today, he was test for COVID and did test positive. He has had right upper lobectomy and had quad bypass and is worried with his history.

## 2021-10-29 ENCOUNTER — Ambulatory Visit (INDEPENDENT_AMBULATORY_CARE_PROVIDER_SITE_OTHER): Payer: Medicare PPO

## 2021-10-29 DIAGNOSIS — I442 Atrioventricular block, complete: Secondary | ICD-10-CM | POA: Diagnosis not present

## 2021-10-31 LAB — CUP PACEART REMOTE DEVICE CHECK
Battery Remaining Longevity: 22 mo
Battery Voltage: 2.93 V
Brady Statistic AP VP Percent: 35.3 %
Brady Statistic AP VS Percent: 0.23 %
Brady Statistic AS VP Percent: 62.89 %
Brady Statistic AS VS Percent: 1.58 %
Brady Statistic RA Percent Paced: 34.93 %
Brady Statistic RV Percent Paced: 97.22 %
Date Time Interrogation Session: 20230203190439
Implantable Lead Implant Date: 20160531
Implantable Lead Implant Date: 20160531
Implantable Lead Location: 753859
Implantable Lead Location: 753860
Implantable Lead Model: 5076
Implantable Lead Model: 5076
Implantable Pulse Generator Implant Date: 20160531
Lead Channel Impedance Value: 342 Ohm
Lead Channel Impedance Value: 342 Ohm
Lead Channel Impedance Value: 399 Ohm
Lead Channel Impedance Value: 456 Ohm
Lead Channel Pacing Threshold Amplitude: 0.75 V
Lead Channel Pacing Threshold Amplitude: 2 V
Lead Channel Pacing Threshold Pulse Width: 0.4 ms
Lead Channel Pacing Threshold Pulse Width: 0.4 ms
Lead Channel Sensing Intrinsic Amplitude: 1.125 mV
Lead Channel Sensing Intrinsic Amplitude: 1.125 mV
Lead Channel Sensing Intrinsic Amplitude: 11.875 mV
Lead Channel Sensing Intrinsic Amplitude: 11.875 mV
Lead Channel Setting Pacing Amplitude: 2.5 V
Lead Channel Setting Pacing Amplitude: 2.5 V
Lead Channel Setting Pacing Pulse Width: 0.4 ms
Lead Channel Setting Sensing Sensitivity: 1.2 mV

## 2021-11-04 NOTE — Progress Notes (Signed)
Remote pacemaker transmission.   

## 2022-01-28 ENCOUNTER — Ambulatory Visit (INDEPENDENT_AMBULATORY_CARE_PROVIDER_SITE_OTHER): Payer: Medicare PPO

## 2022-01-28 DIAGNOSIS — I442 Atrioventricular block, complete: Secondary | ICD-10-CM

## 2022-01-29 LAB — CUP PACEART REMOTE DEVICE CHECK
Battery Remaining Longevity: 21 mo
Battery Voltage: 2.93 V
Brady Statistic AP VP Percent: 40.08 %
Brady Statistic AP VS Percent: 0.17 %
Brady Statistic AS VP Percent: 58.14 %
Brady Statistic AS VS Percent: 1.62 %
Brady Statistic RA Percent Paced: 39.2 %
Brady Statistic RV Percent Paced: 96.51 %
Date Time Interrogation Session: 20230505151926
Implantable Lead Implant Date: 20160531
Implantable Lead Implant Date: 20160531
Implantable Lead Location: 753859
Implantable Lead Location: 753860
Implantable Lead Model: 5076
Implantable Lead Model: 5076
Implantable Pulse Generator Implant Date: 20160531
Lead Channel Impedance Value: 342 Ohm
Lead Channel Impedance Value: 342 Ohm
Lead Channel Impedance Value: 380 Ohm
Lead Channel Impedance Value: 456 Ohm
Lead Channel Pacing Threshold Amplitude: 0.875 V
Lead Channel Pacing Threshold Amplitude: 2 V
Lead Channel Pacing Threshold Pulse Width: 0.4 ms
Lead Channel Pacing Threshold Pulse Width: 0.4 ms
Lead Channel Sensing Intrinsic Amplitude: 1 mV
Lead Channel Sensing Intrinsic Amplitude: 1 mV
Lead Channel Sensing Intrinsic Amplitude: 15.375 mV
Lead Channel Sensing Intrinsic Amplitude: 15.375 mV
Lead Channel Setting Pacing Amplitude: 2.5 V
Lead Channel Setting Pacing Amplitude: 2.5 V
Lead Channel Setting Pacing Pulse Width: 0.4 ms
Lead Channel Setting Sensing Sensitivity: 1.2 mV

## 2022-02-10 NOTE — Progress Notes (Signed)
Remote pacemaker transmission.   

## 2022-06-16 ENCOUNTER — Ambulatory Visit (INDEPENDENT_AMBULATORY_CARE_PROVIDER_SITE_OTHER): Payer: Medicare PPO

## 2022-06-16 DIAGNOSIS — I442 Atrioventricular block, complete: Secondary | ICD-10-CM | POA: Diagnosis not present

## 2022-06-16 LAB — CUP PACEART REMOTE DEVICE CHECK
Battery Remaining Longevity: 17 mo
Battery Voltage: 2.91 V
Brady Statistic AP VP Percent: 38.58 %
Brady Statistic AP VS Percent: 0.01 %
Brady Statistic AS VP Percent: 61.3 %
Brady Statistic AS VS Percent: 0.11 %
Brady Statistic RA Percent Paced: 38.45 %
Brady Statistic RV Percent Paced: 99.77 %
Date Time Interrogation Session: 20230919215700
Implantable Lead Implant Date: 20160531
Implantable Lead Implant Date: 20160531
Implantable Lead Location: 753859
Implantable Lead Location: 753860
Implantable Lead Model: 5076
Implantable Lead Model: 5076
Implantable Pulse Generator Implant Date: 20160531
Lead Channel Impedance Value: 323 Ohm
Lead Channel Impedance Value: 342 Ohm
Lead Channel Impedance Value: 380 Ohm
Lead Channel Impedance Value: 456 Ohm
Lead Channel Pacing Threshold Amplitude: 1 V
Lead Channel Pacing Threshold Amplitude: 2 V
Lead Channel Pacing Threshold Pulse Width: 0.4 ms
Lead Channel Pacing Threshold Pulse Width: 0.4 ms
Lead Channel Sensing Intrinsic Amplitude: 1.125 mV
Lead Channel Sensing Intrinsic Amplitude: 1.125 mV
Lead Channel Sensing Intrinsic Amplitude: 13.75 mV
Lead Channel Sensing Intrinsic Amplitude: 13.75 mV
Lead Channel Setting Pacing Amplitude: 2.5 V
Lead Channel Setting Pacing Amplitude: 2.5 V
Lead Channel Setting Pacing Pulse Width: 0.4 ms
Lead Channel Setting Sensing Sensitivity: 1.2 mV

## 2022-06-28 NOTE — Progress Notes (Signed)
Remote pacemaker transmission.   

## 2022-08-03 IMAGING — CR DG CHEST 2V
1 series · 2 of 2 positions shown · non-contrast
Comparison: 10/29/2015

CLINICAL DATA: Chest pain, cough

EXAM:
CHEST - 2 VIEW

[Series 1: dg chest 2 view · 0.14mm/px · 2 of 2 slices shown]
[im 1/2]
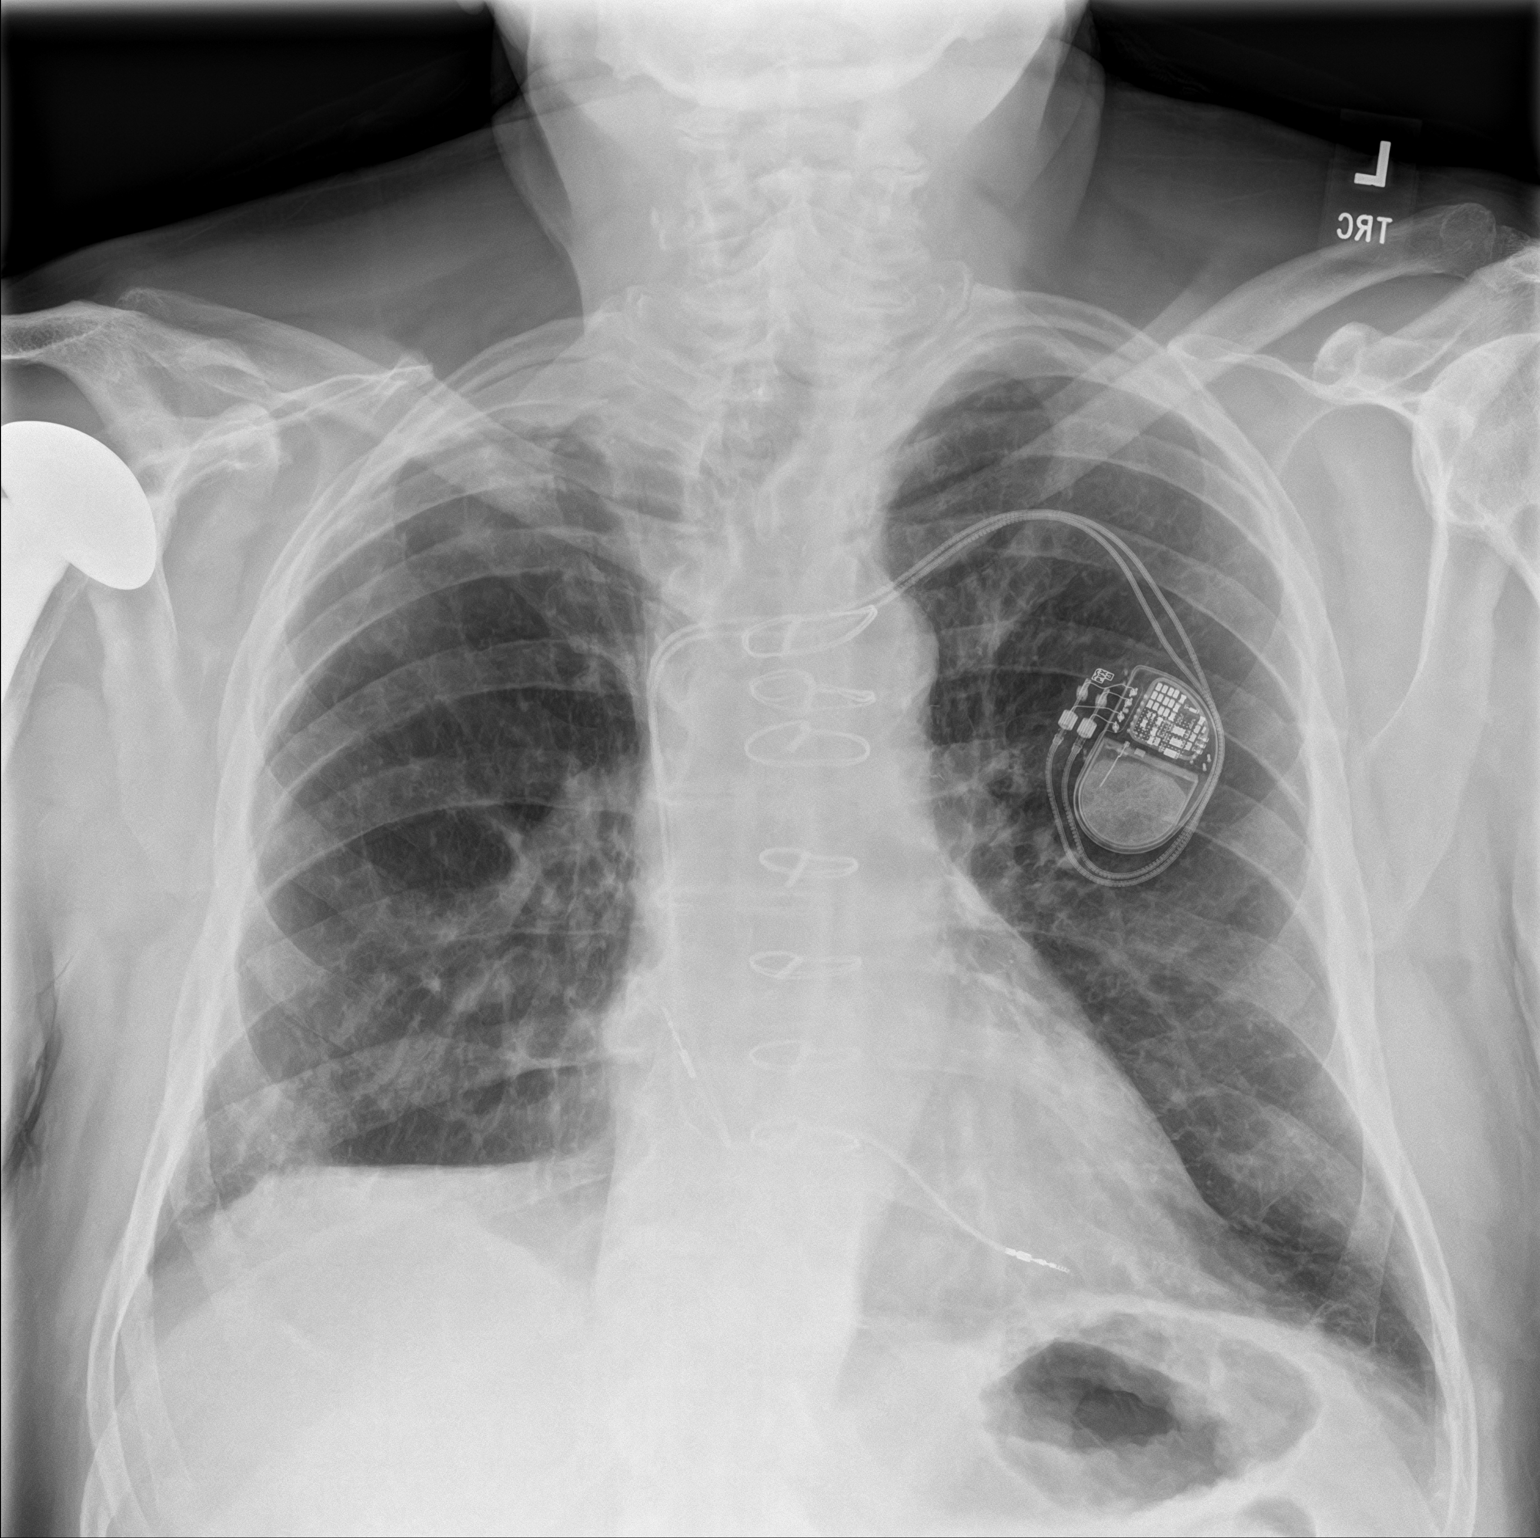
[im 2/2]
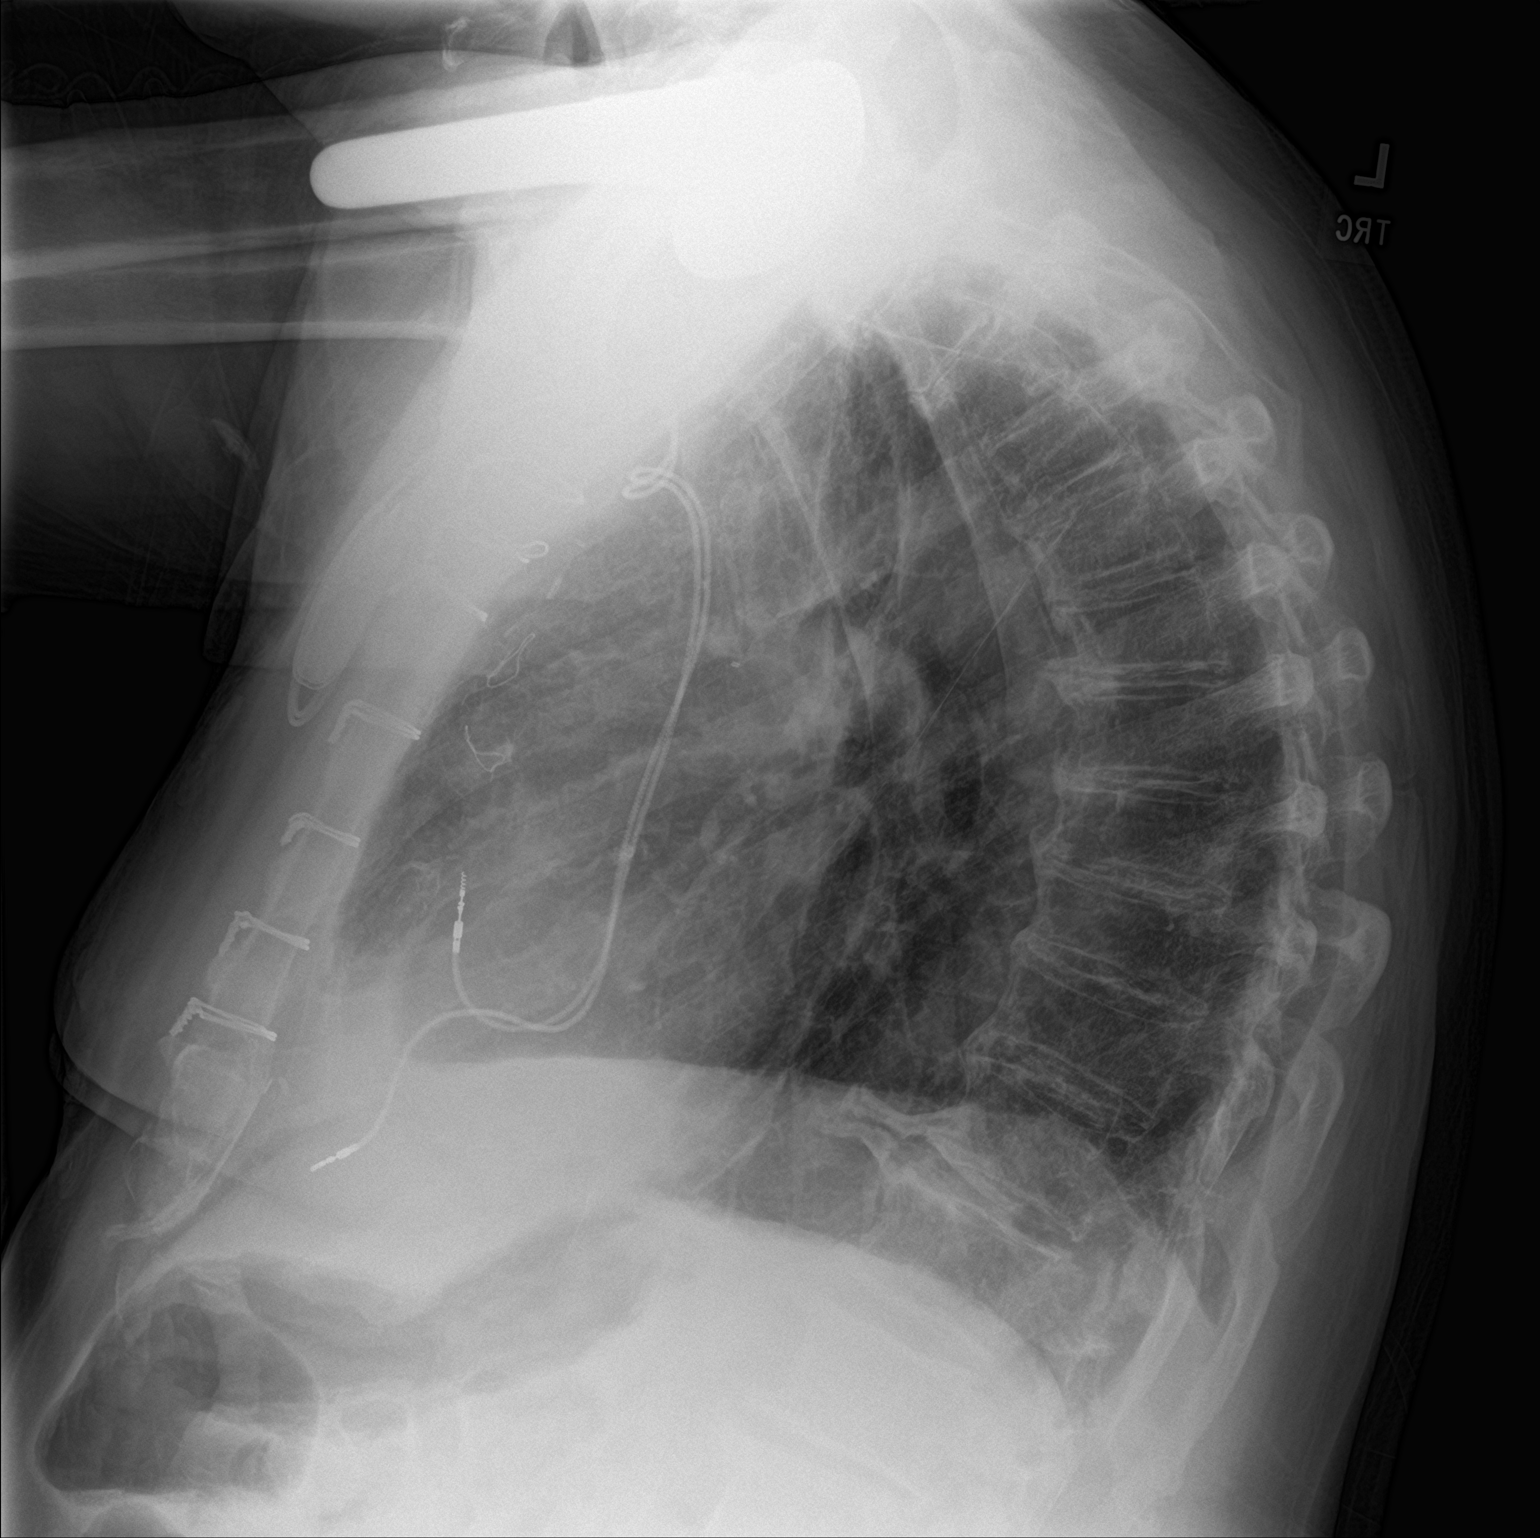

[2 of 2 positions shown; findings below may reference images not displayed]

FINDINGS: Pulmonary interstitial coarsening is likely chronic in nature. No
superimposed confluent pulmonary infiltrate. Right apical pleural
thickening is unchanged. No pneumothorax or pleural effusion. Median
sternotomy has been performed. Cardiac size within normal limits.
Left subclavian dual lead pacemaker is unchanged. No acute bone
abnormality. Right shoulder arthroplasty partially visualized.
IMPRESSION: No active cardiopulmonary disease.

## 2022-09-28 ENCOUNTER — Ambulatory Visit (INDEPENDENT_AMBULATORY_CARE_PROVIDER_SITE_OTHER): Payer: Medicare HMO

## 2022-09-28 ENCOUNTER — Telehealth: Payer: Self-pay

## 2022-09-28 DIAGNOSIS — I4729 Other ventricular tachycardia: Secondary | ICD-10-CM

## 2022-09-28 DIAGNOSIS — Z950 Presence of cardiac pacemaker: Secondary | ICD-10-CM

## 2022-09-28 DIAGNOSIS — I442 Atrioventricular block, complete: Secondary | ICD-10-CM | POA: Diagnosis not present

## 2022-09-28 DIAGNOSIS — I2510 Atherosclerotic heart disease of native coronary artery without angina pectoris: Secondary | ICD-10-CM

## 2022-09-28 LAB — CUP PACEART REMOTE DEVICE CHECK
Battery Remaining Longevity: 14 mo
Battery Voltage: 2.9 V
Brady Statistic AP VP Percent: 51.96 %
Brady Statistic AP VS Percent: 0.01 %
Brady Statistic AS VP Percent: 47.98 %
Brady Statistic AS VS Percent: 0.05 %
Brady Statistic RA Percent Paced: 51.78 %
Brady Statistic RV Percent Paced: 99.73 %
Date Time Interrogation Session: 20240102142358
Implantable Lead Connection Status: 753985
Implantable Lead Connection Status: 753985
Implantable Lead Implant Date: 20160531
Implantable Lead Implant Date: 20160531
Implantable Lead Location: 753859
Implantable Lead Location: 753860
Implantable Lead Model: 5076
Implantable Lead Model: 5076
Implantable Pulse Generator Implant Date: 20160531
Lead Channel Impedance Value: 323 Ohm
Lead Channel Impedance Value: 342 Ohm
Lead Channel Impedance Value: 380 Ohm
Lead Channel Impedance Value: 494 Ohm
Lead Channel Pacing Threshold Amplitude: 0.875 V
Lead Channel Pacing Threshold Amplitude: 2 V
Lead Channel Pacing Threshold Pulse Width: 0.4 ms
Lead Channel Pacing Threshold Pulse Width: 0.4 ms
Lead Channel Sensing Intrinsic Amplitude: 0.5 mV
Lead Channel Sensing Intrinsic Amplitude: 0.5 mV
Lead Channel Sensing Intrinsic Amplitude: 20.375 mV
Lead Channel Sensing Intrinsic Amplitude: 20.375 mV
Lead Channel Setting Pacing Amplitude: 2.5 V
Lead Channel Setting Pacing Amplitude: 2.5 V
Lead Channel Setting Pacing Pulse Width: 0.4 ms
Lead Channel Setting Sensing Sensitivity: 1.2 mV
Zone Setting Status: 755011

## 2022-09-28 NOTE — Telephone Encounter (Signed)
Alert received from [employer]:  1 NSVT logged, 19 secs per interval plot on 09/06/22. Routing for further review per protocol.   Outreach made to Pt.   Spoke with Pt's wife.  Pt will be in [place] for one more week and then they are headed to [place] until April 2024.  Made follow up appt for Pt in May 2024.  Pt is taking Toprol as ordered.  Advised would call if any changes are advised.

## 2022-10-13 NOTE — Telephone Encounter (Signed)
Jose [name]  I guess he is gone to [facility]-- can you see if he has access to getting an echo down there

## 2022-10-15 NOTE — Telephone Encounter (Signed)
Left detailed message requesting call back. 

## 2022-10-18 NOTE — Telephone Encounter (Signed)
Pt returning nurse call

## 2022-10-18 NOTE — Telephone Encounter (Signed)
LM returning patient's call.  He has requested we call him at [phone]

## 2022-10-18 NOTE — Telephone Encounter (Signed)
LM on cell # to call back

## 2022-10-19 NOTE — Telephone Encounter (Signed)
Outreach made to Pt.  He is currently in [place] and does not have a cardiologist there.  Discussed with Dr. Barnetta Hammersmith plan to get echocardiogram in April when Pt returns from [place].  Order entered.    Advised Pt if he has any  heart racing, chest pain or sob he should seek attention in [place].  Pt aware and  in agreement.

## 2022-10-22 NOTE — Progress Notes (Signed)
Remote pacemaker transmission.   

## 2022-12-28 ENCOUNTER — Ambulatory Visit (INDEPENDENT_AMBULATORY_CARE_PROVIDER_SITE_OTHER): Payer: Medicare HMO

## 2022-12-28 DIAGNOSIS — I495 Sick sinus syndrome: Secondary | ICD-10-CM

## 2023-01-04 LAB — CUP PACEART REMOTE DEVICE CHECK
Battery Remaining Longevity: 11 mo
Battery Voltage: 2.89 V
Brady Statistic AP VP Percent: 56.64 %
Brady Statistic AP VS Percent: 0.02 %
Brady Statistic AS VP Percent: 43.17 %
Brady Statistic AS VS Percent: 0.17 %
Brady Statistic RA Percent Paced: 55.34 %
Brady Statistic RV Percent Paced: 99.21 %
Date Time Interrogation Session: 20240408125417
Implantable Lead Connection Status: 753985
Implantable Lead Connection Status: 753985
Implantable Lead Implant Date: 20160531
Implantable Lead Implant Date: 20160531
Implantable Lead Location: 753859
Implantable Lead Location: 753860
Implantable Lead Model: 5076
Implantable Lead Model: 5076
Implantable Pulse Generator Implant Date: 20160531
Lead Channel Impedance Value: 304 Ohm
Lead Channel Impedance Value: 342 Ohm
Lead Channel Impedance Value: 361 Ohm
Lead Channel Impedance Value: 494 Ohm
Lead Channel Pacing Threshold Amplitude: 0.75 V
Lead Channel Pacing Threshold Amplitude: 1.625 V
Lead Channel Pacing Threshold Pulse Width: 0.4 ms
Lead Channel Pacing Threshold Pulse Width: 0.4 ms
Lead Channel Sensing Intrinsic Amplitude: 1 mV
Lead Channel Sensing Intrinsic Amplitude: 1 mV
Lead Channel Sensing Intrinsic Amplitude: 19.125 mV
Lead Channel Sensing Intrinsic Amplitude: 19.125 mV
Lead Channel Setting Pacing Amplitude: 2.5 V
Lead Channel Setting Pacing Amplitude: 2.5 V
Lead Channel Setting Pacing Pulse Width: 0.4 ms
Lead Channel Setting Sensing Sensitivity: 1.2 mV
Zone Setting Status: 755011

## 2023-01-25 ENCOUNTER — Ambulatory Visit: Payer: Medicare HMO | Attending: Internal Medicine

## 2023-01-25 DIAGNOSIS — I2510 Atherosclerotic heart disease of native coronary artery without angina pectoris: Secondary | ICD-10-CM

## 2023-01-25 DIAGNOSIS — I4729 Other ventricular tachycardia: Secondary | ICD-10-CM | POA: Diagnosis not present

## 2023-01-25 LAB — ECHOCARDIOGRAM COMPLETE
AR max vel: 2.27 cm2
AV Area VTI: 2.28 cm2
AV Area mean vel: 2.23 cm2
AV Mean grad: 4 mmHg
AV Peak grad: 7.1 mmHg
Ao pk vel: 1.33 m/s
Area-P 1/2: 2.37 cm2
Calc EF: 53 %
S' Lateral: 2.9 cm
Single Plane A2C EF: 53.8 %
Single Plane A4C EF: 52.8 %

## 2023-01-25 MED ORDER — PERFLUTREN LIPID MICROSPHERE
1.0000 mL | INTRAVENOUS | Status: AC | PRN
  Administered 2023-01-25: 2 mL via INTRAVENOUS

## 2023-01-31 DIAGNOSIS — I493 Ventricular premature depolarization: Secondary | ICD-10-CM | POA: Insufficient documentation

## 2023-02-03 ENCOUNTER — Encounter: Payer: Self-pay | Admitting: Internal Medicine

## 2023-02-03 ENCOUNTER — Ambulatory Visit: Payer: Medicare HMO | Attending: Internal Medicine | Admitting: Internal Medicine

## 2023-02-03 VITALS — BP 114/56 | HR 69 | Ht 73.0 in | Wt 225.0 lb

## 2023-02-03 DIAGNOSIS — Z950 Presence of cardiac pacemaker: Secondary | ICD-10-CM

## 2023-02-03 DIAGNOSIS — I480 Paroxysmal atrial fibrillation: Secondary | ICD-10-CM

## 2023-02-03 DIAGNOSIS — Z79899 Other long term (current) drug therapy: Secondary | ICD-10-CM

## 2023-02-03 DIAGNOSIS — I493 Ventricular premature depolarization: Secondary | ICD-10-CM | POA: Diagnosis not present

## 2023-02-03 DIAGNOSIS — I442 Atrioventricular block, complete: Secondary | ICD-10-CM | POA: Diagnosis not present

## 2023-02-03 MED ORDER — SPIRONOLACTONE 25 MG PO TABS: 12.5000 mg | ORAL_TABLET | Freq: Every day | ORAL | 3 refills | Status: DC

## 2023-02-03 MED ORDER — SPIRONOLACTONE 25 MG PO TABS: 25.0000 mg | ORAL_TABLET | Freq: Every day | ORAL | 3 refills | Status: DC

## 2023-02-03 MED ORDER — SPIRONOLACTONE 25 MG PO TABS: 12.5000 mg | ORAL_TABLET | Freq: Every day | ORAL | 3 refills | Status: AC

## 2023-02-03 NOTE — Progress Notes (Signed)
Patient ID: Merlinda Frederick, male   DOB: [birth year], 82 y.o.   MRN: [id number]      Patient Care Team: Danella Penton, MD as PCP - General (Internal Medicine)   HPI  Burhanuddin JORY ANCELET is a 82 y.o. 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  He acknowledges alcohol intake of 4-5 ounces of bourbon per night.He has reduce the ounces of alcohol he drinks at night    The patient denies chest pain, nocturnal dyspnea, orthopnea or peripheral edema.  There have been no palpitations, lightheadedness or syncope.  Complains of more dyspnea on exertion particular over the last 6 months.  Continues to drink, 2 "big boys "each night ".   Remote monitoring had demonstrated VT-nonsustained, echocardiogram was obtained.  See below.    His neuropathy has not improved and states he has to walk slow  DATE TEST EF   12/16 Echo   40-45 %   3/18 Echo   55-60 %   06/21 Echo 50-55%   4/24 Echo  40-45% The    Date Cr K Hgb LDL  12/20 0.8 4.6 15.4   4/09 1.0 4.4 14.5   3/22 0.9 4.6 15.0   11/23 0.8 4.4 14.0 44     Records and Results Reviewed   Past Medical History:  Diagnosis Date   Coronary artery disease 2002   RCA stent, 2002. 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 cancerous)   Hypercholesteremia    Hypertension    Hypothyroidism    MI (myocardial infarction) [facility])    Pacemaker     Past Surgical History:  Procedure Laterality Date   CARDIAC CATHETERIZATION     CARDIAC CATHETERIZATION Left 09/12/2015   Procedure: Left Heart Cath and Coronary Angiography;  Surgeon:  Antonieta Iba, MD;  Location: [facility] INVASIVE CV LAB;  Service: Cardiovascular;  Laterality: Left;   CARPAL TUNNEL RELEASE Bilateral    CORONARY ANGIOPLASTY WITH STENT PLACEMENT     CORONARY ARTERY BYPASS GRAFT N/A 09/23/2015   Procedure: CORONARY ARTERY BYPASS GRAFTING (CABG), ON PUMP, TIMES FOUR, USING LEFT INTERNAL MAMMARY ARTERY, RIGHT GREATER SAPHENOUS VEIN HARVESTED ENDOSCOPICALLY;  Surgeon: Alleen Borne, MD;  Location: [facility] OR;  Service: Open Heart Surgery;  Laterality: N/A;   HAMMER TOE SURGERY     INSERT / REPLACE / REMOVE PACEMAKER  02/25/2015   Serial # [id number] H Model # [id number]/ DDD   LOBECTOMY     upper right    SHOULDER SURGERY Right    replacement   TEE WITHOUT CARDIOVERSION N/A 09/23/2015   Procedure: TRANSESOPHAGEAL ECHOCARDIOGRAM (TEE);  Surgeon: Alleen Borne, MD;  Location: [facility] OR;  Service: Open Heart Surgery;  Laterality: N/A;   TOTAL HIP ARTHROPLASTY Right 06/14/2016   Procedure: TOTAL HIP ARTHROPLASTY;  Surgeon: Donato Heinz, MD;  Location: [facility] ORS;  Service: Orthopedics;  Laterality: Right;   TOTAL KNEE ARTHROPLASTY     right    Current Outpatient Medications  Medication Sig Dispense Refill   aspirin 81 MG EC tablet Take 1 tablet (81  mg total) by mouth daily.     gabapentin (NEURONTIN) 300 MG capsule Take 300 mg by mouth 2 (two) times daily.      levothyroxine (SYNTHROID, LEVOTHROID) 25 MCG tablet Take 25 mcg by mouth daily before breakfast.      meloxicam (MOBIC) 15 MG tablet Take 15 mg by mouth daily.     metoprolol succinate (TOPROL-XL) 25 MG 24 hr tablet TAKE 1 TABLET(25 MG) BY MOUTH DAILY 90 tablet 1   Multiple Vitamins-Minerals (MULTIVITAMIN WITH MINERALS) tablet Take 1 tablet by mouth daily.     simvastatin (ZOCOR) 40 MG tablet TAKE 1 TABLET BY MOUTH DAILY AT 6PM 90 tablet 2   temazepam (RESTORIL) 15 MG capsule Take 15 mg by mouth at bedtime as needed.     vitamin B-12 (CYANOCOBALAMIN) 1000 MCG tablet Take 1,000 mcg by mouth daily.     latanoprost  (XALATAN) 0.005 % ophthalmic solution Place 1 drop into the right eye at bedtime.  (Patient not taking: Reported on 02/03/2023)     No current facility-administered medications for this visit.    Allergies  Allergen Reactions   Amoxicillin Nausea Only   Amoxicillin-Pot Clavulanate Other (See Comments) and Nausea Only    Patient reports he took oral Augmentin after a foot surgery, and it caused severe nausea; however reports that he can take 'plain' amoxicillin; he takes amoxicillin prior to any dentist procedures, and has on hand Patient reports he took oral Augmentin after a foot surgery, and it caused severe nausea; however reports that he can take 'plain' amoxicillin; he takes amoxicillin prior to any dentist procedures, and has on hand Patient reports he took oral Augmentin after a foot surgery, and it caused severe nausea; however reports that he can take 'plain' amoxicillin; he takes amoxicillin prior to any dentist procedures, and has on hand     Review of Systems negative except from HPI and PMH  Physical Exam: BP (!) 114/56   Pulse 69   Ht 6\' 1"  (1.854 m)   Wt 225 lb (102.1 kg)   SpO2 98%   BMI 29.69 kg/m  Well developed and well nourished in no acute distress HENT normal Neck supple with JVP-flat Clear Device pocket well healed; without hematoma or erythema.  There is no tethering  Regular rate and rhythm, no  murmur Abd-soft with active BS No Clubbing cyanosis  edema Skin-warm and dry A & Oriented  Grossly normal sensory and motor function  ECG AV pacing at 69 QRSd 198  Device function is normal.Programming changes monitoring detection rate decreased from 150--130 See Paceart for details     Assessment and  Plan:  Complete heart block    Pacemaker-Medtronic  Dyspnea on exertion  Cardiomyopathy-aggressive-midrange   Coronary artery disease with prior stenting and interval bypass surgery   Atrial fibrillation-nonsustained  PVCs (0.8% V sensed  events)  Alcohol intake-exuberant  Peripheral neuropathy   Worsening complaints of dyspnea.  Euvolemic on exam.  With his midrange ejection fraction and worsening, we will begin him on spironolactone, have reviewed side effects and potential benefits.  Will check a metabolic profile in 2 weeks.  Potential contributors to his cardiomyopathy include alcohol, progressive coronary disease, pacemaker associated cardiomyopathy.  Each has strong things to support it, I encouraged him to decrease his alcohol intake, I have asked him to consider premature replacement of his device with LV/left bundle branch area lead placement.  He may also benefit from Myoview scanning.  Continue aspirin and his simvastatin.  LDL is  at goal.  Continue with metoprolol.  No significant atrial fibrillation detected by his device.  Not sufficiently long to justify anticoagulation (SCAF)  PVC burden appears to remain low at about 0.8%  I also reminded him that alcohol could be aggravating his peripheral neuropathy

## 2023-02-03 NOTE — Patient Instructions (Signed)
Medication Instructions:  Your physician has recommended you make the following change in your medication:   ** Begin Spironolactone 25mg  - 1/2 tablet by mouth daily.  *If you need a refill on your cardiac medications before your next appointment, please call your pharmacy*   Lab Work: BMET in 2 weeks If you have labs (blood work) drawn today and your tests are completely normal, you will receive your results only by: MyChart Message (if you have MyChart) OR A paper copy in the mail If you have any lab test that is abnormal or we need to change your treatment, we will call you to review the results.   Testing/Procedures: None ordered.    Follow-Up: At [facility], you and your health needs are our priority.  As part of our continuing mission to provide you with exceptional heart care, we have created designated Provider Care Teams.  These Care Teams include your primary Cardiologist (physician) and Advanced Practice Providers (APPs -  Physician Assistants and Nurse Practitioners) who all work together to provide you with the care you need, when you need it.  We recommend signing up for the patient portal called "MyChart".  Sign up information is provided on this After Visit Summary.  MyChart is used to connect with patients for Virtual Visits (Telemedicine).  Patients are able to view lab/test results, encounter notes, upcoming appointments, etc.  Non-urgent messages can be sent to your provider as well.   To learn more about what you can do with MyChart, go to [email or web address].    Your next appointment:   6 months with Dr Graciela Husbands

## 2023-02-08 NOTE — Progress Notes (Signed)
Remote pacemaker transmission.   

## 2023-04-06 ENCOUNTER — Ambulatory Visit (INDEPENDENT_AMBULATORY_CARE_PROVIDER_SITE_OTHER): Payer: Medicare HMO

## 2023-04-06 DIAGNOSIS — I442 Atrioventricular block, complete: Secondary | ICD-10-CM

## 2023-04-06 LAB — CUP PACEART REMOTE DEVICE CHECK
Battery Remaining Longevity: 9 mo
Battery Voltage: 2.87 V
Brady Statistic AP VP Percent: 46.83 %
Brady Statistic AP VS Percent: 0.03 %
Brady Statistic AS VP Percent: 52.86 %
Brady Statistic AS VS Percent: 0.28 %
Brady Statistic RA Percent Paced: 46.32 %
Brady Statistic RV Percent Paced: 99.12 %
Date Time Interrogation Session: 20240710152409
Implantable Lead Connection Status: 753985
Implantable Lead Connection Status: 753985
Implantable Lead Implant Date: 20160531
Implantable Lead Implant Date: 20160531
Implantable Lead Location: 753859
Implantable Lead Location: 753860
Implantable Lead Model: 5076
Implantable Lead Model: 5076
Implantable Pulse Generator Implant Date: 20160531
Lead Channel Impedance Value: 323 Ohm
Lead Channel Impedance Value: 342 Ohm
Lead Channel Impedance Value: 380 Ohm
Lead Channel Impedance Value: 475 Ohm
Lead Channel Pacing Threshold Amplitude: 0.75 V
Lead Channel Pacing Threshold Amplitude: 2 V
Lead Channel Pacing Threshold Pulse Width: 0.4 ms
Lead Channel Pacing Threshold Pulse Width: 0.4 ms
Lead Channel Sensing Intrinsic Amplitude: 1.125 mV
Lead Channel Sensing Intrinsic Amplitude: 1.125 mV
Lead Channel Sensing Intrinsic Amplitude: 13.5 mV
Lead Channel Sensing Intrinsic Amplitude: 13.5 mV
Lead Channel Setting Pacing Amplitude: 2.5 V
Lead Channel Setting Pacing Amplitude: 2.5 V
Lead Channel Setting Pacing Pulse Width: 0.4 ms
Lead Channel Setting Sensing Sensitivity: 1.2 mV
Zone Setting Status: 755011

## 2023-04-27 NOTE — Progress Notes (Signed)
Remote pacemaker transmission.
